# Patient Record
Sex: Female | Born: 2000 | Race: Black or African American | Hispanic: No | Marital: Single | Smoking: Never smoker
Health system: Southern US, Community
[De-identification: ages and names within clinical notes are randomized; demographics above are authoritative.]

## PROBLEM LIST (undated history)

## (undated) DIAGNOSIS — R519 Headache, unspecified: Secondary | ICD-10-CM

## (undated) DIAGNOSIS — N390 Urinary tract infection, site not specified: Secondary | ICD-10-CM

## (undated) DIAGNOSIS — R51 Headache: Secondary | ICD-10-CM

## (undated) HISTORY — DX: Urinary tract infection, site not specified: N39.0

## (undated) HISTORY — DX: Headache: R51

## (undated) HISTORY — DX: Headache, unspecified: R51.9

---
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
## 2021-01-11 ENCOUNTER — Other Ambulatory Visit: Payer: Self-pay | Admitting: Pediatrics

## 2021-02-03 ENCOUNTER — Ambulatory Visit (HOSPITAL_COMMUNITY)
Admission: EM | Admit: 2021-02-03 | Discharge: 2021-02-03 | Disposition: A | Discharge status: Home / Self Care | Payer: Medicaid Other | Attending: Physician Assistant | Admitting: Physician Assistant

## 2021-02-03 ENCOUNTER — Other Ambulatory Visit: Payer: Self-pay

## 2021-02-03 ENCOUNTER — Encounter (HOSPITAL_COMMUNITY): Payer: Self-pay | Admitting: Emergency Medicine

## 2021-02-03 DIAGNOSIS — N898 Other specified noninflammatory disorders of vagina: Secondary | ICD-10-CM | POA: Insufficient documentation

## 2021-02-03 DIAGNOSIS — N760 Acute vaginitis: Secondary | ICD-10-CM | POA: Insufficient documentation

## 2021-02-03 MED ORDER — FLUCONAZOLE 150 MG PO TABS: 150.0000 mg | ORAL_TABLET | Freq: Every day | ORAL | 0 refills | Status: DC

## 2021-02-03 NOTE — ED Triage Notes (Signed)
Patient c/o vaginal itching x 5 days.   Patient endorses " I think I have a yeast infection, last time this happened that's how I felt".   Patient denies any abnormal discharge. Patient denies any ABD pain or dysuria.   Patient endorses " wearing clothes makes it feel uncomfortable".   Patient hasn't used any medications for symptoms.

## 2021-02-03 NOTE — ED Provider Notes (Signed)
[facility]    CSN: [id number] Arrival date & time: 02/03/21  1318      History   Chief Complaint Chief Complaint  Patient presents with  . Vaginal Itching    HPI Madison Garcia is a 20 y.o. female.   Patient presents today with a several day history of vaginal irritation.  She reports thick white discharge with associated pruritus.  Denies any odor.  She denies any new sexual partners.  Denies changes to personal hygiene products including soaps, detergents, sanitary napkins, tampons.  She denies any recent antibiotic use.  She has a history of yeast and bacterial vaginosis and states current symptoms are similar to previous episodes of this condition.  She denies history of STI.  She denies associated fever, abdominal pain, pelvic pain, nausea, vomiting.  She has not tried any over-the-counter medications for symptom management.     Past Medical History:  Diagnosis Date  . Headache   . Urinary tract infection     Patient Active Problem List   Diagnosis Date Noted  . Episodic tension-type headache, not intractable 03/20/2019  . Vaginal discharge 03/04/2019  . Hematocolpos 09/17/2018  . Solitary kidney 09/17/2018  . 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 Irregular menses 08/19/2018  . Cystitis 08/19/2018  . Migraine without aura and without status migrainosus, not intractable 09/02/2014  . Tension headache 09/02/2014    History reviewed. No pertinent surgical history.  OB History   No obstetric history on file.      Home Medications    Prior to Admission medications   Medication Sig Start Date End Date Taking? Authorizing Provider  fluconazole (DIFLUCAN) 150 MG tablet Take 1 tablet (150 mg total) by mouth daily. Can take second dose 72 hours after first if symptoms persist. 02/03/21  Yes Sheryl Towell, Noberto Retort, PA-C  JUNEL 1.5/30 1.5-30 MG-MCG tablet TAKE 1 TABLET BY MOUTH EVERY DAY  01/11/21  Yes Verneda Skill, FNP    Family History Family History  Problem Relation Age of Onset  . Migraines Maternal Grandmother   . Migraines Other   . Autism Other     Social History Social History   Tobacco Use  . Smoking status: Never Smoker  . Smokeless tobacco: Never Used  Substance Use Topics  . Alcohol use: No    Comment: pt is 20yo  . Drug use: No     Allergies   Patient has no known allergies.   Review of Systems Review of Systems  Constitutional: Negative for activity change, appetite change, fatigue and fever.  Respiratory: Negative for cough and shortness of breath.   Cardiovascular: Negative for chest pain.  Gastrointestinal: Negative for abdominal pain, diarrhea, nausea and vomiting.  Genitourinary: Positive for vaginal discharge. Negative for dysuria, frequency, urgency, vaginal bleeding and vaginal pain.  Neurological: Negative for dizziness, light-headedness and headaches.     Physical Exam Triage Vital Signs ED Triage Vitals  Enc Vitals Group     BP 02/03/21 1335 111/72     Pulse Rate 02/03/21 1335 88     Resp 02/03/21 1335 16     Temp 02/03/21 1335 98.6 F (37 C)     Temp Source 02/03/21 1335 Oral     SpO2 02/03/21 1335 98 %     Weight --      Height --      Head Circumference --      Peak Flow --  Pain Score 02/03/21 1333 0     Pain Loc --      Pain Edu? --      Excl. in GC? --    No data found.  Updated Vital Signs BP 111/72 (BP Location: Right Arm)   Pulse 88   Temp 98.6 F (37 C) (Oral)   Resp 16   LMP  (LMP Unknown)   SpO2 98%   Visual Acuity Right Eye Distance:   Left Eye Distance:   Bilateral Distance:    Right Eye Near:   Left Eye Near:    Bilateral Near:     Physical Exam Vitals reviewed.  Constitutional:      General: She is awake. She is not in acute distress.    Appearance: Normal appearance. She is not ill-appearing.     Comments: Very pleasant female appears stated age in no acute distress   HENT:     Head: Normocephalic and atraumatic.  Cardiovascular:     Rate and Rhythm: Normal rate and regular rhythm.     Heart sounds: No murmur heard.   Pulmonary:     Effort: Pulmonary effort is normal.     Breath sounds: Normal breath sounds. No wheezing, rhonchi or rales.  Abdominal:     General: Bowel sounds are normal.     Palpations: Abdomen is soft.     Tenderness: There is no abdominal tenderness. There is no right CVA tenderness, left CVA tenderness, guarding or rebound.     Comments: Benign abdominal exam  Genitourinary:    Comments: Exam deferred Psychiatric:        Behavior: Behavior is cooperative.      UC Treatments / Results  Labs (all labs ordered are listed, but only abnormal results are displayed) Labs Reviewed  CERVICOVAGINAL ANCILLARY ONLY    EKG   Radiology No results found.  Procedures Procedures (including critical care time)  Medications Ordered in UC Medications - No data to display  Initial Impression / Assessment and Plan / UC Course  I have reviewed the triage vital signs and the nursing notes.  Pertinent labs & imaging results that were available during my care of the patient were reviewed by me and considered in my medical decision making (see chart for details).     Symptoms consistent with vaginal yeast infection so we will treat with Diflucan.  NuSwab collected today-results pending.  Discussed potential need to change treatment course based on laboratory findings.  She was encouraged to use hypoallergenic soaps and detergents and wear loosefitting cotton underwear.  Strict return precautions given to which patient expressed understanding.  Final Clinical Impressions(s) / UC Diagnoses   Final diagnoses:  Vaginal discharge  Vaginitis and vulvovaginitis     Discharge Instructions     Take Diflucan to treat yeast infection.  We will be in touch with your other results soon as we have these if we need to treat for something  else we will contact you.  Make sure to use hypoallergenic soaps and detergents.  Wear loosefitting cotton underwear.    ED Prescriptions    Medication Sig Dispense Auth. Provider   fluconazole (DIFLUCAN) 150 MG tablet Take 1 tablet (150 mg total) by mouth daily. Can take second dose 72 hours after first if symptoms persist. 2 tablet Avereigh Spainhower K, PA-C     PDMP not reviewed this encounter.   Jeani Hawking, PA-C 02/03/21 1426

## 2021-02-03 NOTE — Discharge Instructions (Signed)
Take Diflucan to treat yeast infection.  We will be in touch with your other results soon as we have these if we need to treat for something else we will contact you.  Make sure to use hypoallergenic soaps and detergents.  Wear loosefitting cotton underwear.

## 2021-02-06 ENCOUNTER — Telehealth (HOSPITAL_COMMUNITY): Payer: Self-pay | Admitting: Emergency Medicine

## 2021-02-06 LAB — CERVICOVAGINAL ANCILLARY ONLY
Bacterial Vaginitis (gardnerella): NEGATIVE
Candida Glabrata: NEGATIVE
Candida Vaginitis: POSITIVE — AB
Chlamydia: NEGATIVE
Comment: NEGATIVE
Comment: NEGATIVE
Comment: NEGATIVE
Comment: NEGATIVE
Comment: NEGATIVE
Comment: NORMAL
Neisseria Gonorrhea: NEGATIVE
Trichomonas: POSITIVE — AB

## 2021-02-06 MED ORDER — METRONIDAZOLE 500 MG PO TABS: 500.0000 mg | ORAL_TABLET | Freq: Two times a day (BID) | ORAL | 0 refills | Status: DC

## 2021-02-16 ENCOUNTER — Encounter (INDEPENDENT_AMBULATORY_CARE_PROVIDER_SITE_OTHER): Payer: Self-pay

## 2021-05-01 ENCOUNTER — Other Ambulatory Visit: Payer: Self-pay

## 2021-05-01 ENCOUNTER — Ambulatory Visit (HOSPITAL_COMMUNITY)
Admission: EM | Admit: 2021-05-01 | Discharge: 2021-05-01 | Disposition: A | Discharge status: Home / Self Care | Payer: Medicaid Other | Attending: Emergency Medicine | Admitting: Emergency Medicine

## 2021-05-01 ENCOUNTER — Encounter (HOSPITAL_COMMUNITY): Payer: Self-pay

## 2021-05-01 DIAGNOSIS — Z113 Encounter for screening for infections with a predominantly sexual mode of transmission: Secondary | ICD-10-CM | POA: Insufficient documentation

## 2021-05-01 DIAGNOSIS — N898 Other specified noninflammatory disorders of vagina: Secondary | ICD-10-CM | POA: Diagnosis not present

## 2021-05-01 LAB — POCT URINALYSIS DIPSTICK, ED / UC
Bilirubin Urine: NEGATIVE
Glucose, UA: NEGATIVE mg/dL
Ketones, ur: NEGATIVE mg/dL
Nitrite: NEGATIVE
Protein, ur: NEGATIVE mg/dL
Specific Gravity, Urine: 1.025 (ref 1.005–1.030)
Urobilinogen, UA: 0.2 mg/dL (ref 0.0–1.0)
pH: 6 (ref 5.0–8.0)

## 2021-05-01 LAB — POC URINE PREG, ED: Preg Test, Ur: NEGATIVE

## 2021-05-01 NOTE — ED Provider Notes (Signed)
[facility]    CSN: [id number] Arrival date & time: 05/01/21  1202      History   Chief Complaint Chief Complaint  Patient presents with   Vaginal Discharge    HPI Madison Garcia is a 20 y.o. female.   Patient here for evaluation of vaginal itching and discharge that has been ongoing for the past week.  Reports similar symptoms with yeast infections in the past.  Also reports some dysuria but denies any frequency, urgency, or flank pain.  Has not tried any OTC medications or treatments.  Denies any trauma, injury, or other precipitating event.  Denies any specific alleviating or aggravating factors.  Denies any fevers, chest pain, shortness of breath, N/V/D, numbness, tingling, weakness, abdominal pain, or headaches.     The history is provided by the patient.  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 No pertinent surgical history.  OB History   No obstetric history on file.      Home Medications    Prior to Admission medications   Medication Sig Start Date End Date Taking? Authorizing Provider  fluconazole (DIFLUCAN) 150 MG tablet Take 1 tablet (150 mg total) by mouth daily. Can take second dose 72 hours after first if symptoms persist. 02/03/21   Raspet, Noberto Retort, PA-C  JUNEL 1.5/30 1.5-30 MG-MCG tablet TAKE 1 TABLET BY MOUTH EVERY DAY 01/11/21   Verneda Skill, FNP   metroNIDAZOLE (FLAGYL) 500 MG tablet Take 1 tablet (500 mg total) by mouth 2 (two) times daily. 02/06/21   Lamptey, Britta Mccreedy, MD    Family History Family History  Problem Relation Age of Onset   Migraines Maternal Grandmother    Migraines Other    Autism Other     Social History Social History   Tobacco Use   Smoking status: Never   Smokeless tobacco: Never  Substance Use Topics   Alcohol use: No    Comment: pt is 20yo   Drug use: No     Allergies   Patient has no known allergies.   Review of Systems Review of Systems  Genitourinary:  Positive for dysuria and vaginal discharge.  All other systems reviewed and are negative.   Physical Exam Triage Vital Signs ED Triage Vitals [05/01/21 1326]  Enc Vitals Group     BP 112/76     Pulse Rate 78     Resp 18     Temp 98.4 F (36.9 C)     Temp Source Oral     SpO2 99 %     Weight      Height      Head Circumference      Peak Flow      Pain Score 0     Pain Loc      Pain Edu?      Excl. in GC?    No data found.  Updated Vital Signs BP 112/76 (BP  Location: Right Arm)   Pulse 78   Temp 98.4 F (36.9 C) (Oral)   Resp 18   SpO2 99%   Visual Acuity Right Eye Distance:   Left Eye Distance:   Bilateral Distance:    Right Eye Near:   Left Eye Near:    Bilateral Near:     Physical Exam Vitals and nursing note reviewed.  Constitutional:      General: She is not in acute distress.    Appearance: Normal appearance. She is not ill-appearing, toxic-appearing or diaphoretic.  HENT:     Head: Normocephalic and atraumatic.  Eyes:     Conjunctiva/sclera: Conjunctivae normal.  Cardiovascular:     Rate and Rhythm: Normal rate.     Pulses: Normal pulses.  Pulmonary:     Effort: Pulmonary effort is normal.  Abdominal:     General: Abdomen is flat.     Tenderness: There is no right CVA tenderness or left CVA tenderness.  Genitourinary:    Comments: declines Musculoskeletal:        General: Normal range of  motion.     Cervical back: Normal range of motion.  Skin:    General: Skin is warm and dry.  Neurological:     General: No focal deficit present.     Mental Status: She is alert and oriented to person, place, and time.  Psychiatric:        Mood and Affect: Mood normal.     UC Treatments / Results  Labs (all labs ordered are listed, but only abnormal results are displayed) Labs Reviewed  POCT URINALYSIS DIPSTICK, ED / UC - Abnormal; Notable for the following components:      Result Value   Hgb urine dipstick TRACE (*)    Leukocytes,Ua SMALL (*)    All other components within normal limits  URINE CULTURE  POC URINE PREG, ED  CERVICOVAGINAL ANCILLARY ONLY    EKG   Radiology No results found.  Procedures Procedures (including critical care time)  Medications Ordered in UC Medications - No data to display  Initial Impression / Assessment and Plan / UC Course  I have reviewed the triage vital signs and the nursing notes.  Pertinent labs & imaging results that were available during my care of the patient were reviewed by me and considered in my medical decision making (see chart for details).    Assessment negative for red flags or concerns.  Urine pregnancy test is negative, urinalysis positive for leukocytes and hgb.  Urine culture pending.  Self swab pending, will treat based on results.  Discussed trying to stay clean and dry but avoiding tight fitting clothing and wearing breathable fabrics to prevent development of yeast.  Also discussed safe sex practices such as condom or other barrier method use.  Follow up with primary care as needed.   Final Clinical Impressions(s) / UC Diagnoses   Final diagnoses:  Vaginal irritation  Screen for STD (sexually transmitted disease)     Discharge Instructions      We will contact you with the results from your lab work and any additional treatment.    Avoid wearing tight fitting clothing.  Try to wear breathable fabrics,  such as cotton.  Keep the area clean and dry to help prevent the development of yeast.    Do not have sex while taking undergoing treatment for STI.  Make sure that all of your partners get tested and treated.   Use a condom or other barrier method  for all sexual encounters.    Return or go to the Emergency Department if symptoms worsen or do not improve in the next few days.      ED Prescriptions   None    PDMP not reviewed this encounter.   Ivette Loyal, NP 05/01/21 [phone]

## 2021-05-01 NOTE — Discharge Instructions (Addendum)
We will contact you with the results from your lab work and any additional treatment.    Avoid wearing tight fitting clothing.  Try to wear breathable fabrics, such as cotton.  Keep the area clean and dry to help prevent the development of yeast.    Do not have sex while taking undergoing treatment for STI.  Make sure that all of your partners get tested and treated.   Use a condom or other barrier method for all sexual encounters.    Return or go to the Emergency Department if symptoms worsen or do not improve in the next few days.

## 2021-05-01 NOTE — ED Triage Notes (Signed)
Pt presents with vaginal discharge and vaginal itching X 1 week.

## 2021-05-02 LAB — CERVICOVAGINAL ANCILLARY ONLY
Bacterial Vaginitis (gardnerella): NEGATIVE
Candida Glabrata: NEGATIVE
Candida Vaginitis: POSITIVE — AB
Chlamydia: NEGATIVE
Comment: NEGATIVE
Comment: NEGATIVE
Comment: NEGATIVE
Comment: NEGATIVE
Comment: NEGATIVE
Comment: NORMAL
Neisseria Gonorrhea: NEGATIVE
Trichomonas: NEGATIVE

## 2021-05-03 LAB — URINE CULTURE: Culture: 3000 — AB

## 2021-05-05 ENCOUNTER — Telehealth: Payer: Self-pay

## 2021-05-05 MED ORDER — FLUCONAZOLE 150 MG PO TABS: 150.0000 mg | ORAL_TABLET | Freq: Every day | ORAL | 0 refills | Status: AC

## 2021-05-18 ENCOUNTER — Other Ambulatory Visit: Payer: Self-pay | Admitting: Pediatrics

## 2021-09-21 ENCOUNTER — Other Ambulatory Visit: Payer: Self-pay | Admitting: Family

## 2022-01-11 IMAGING — CT CT ABD-PELV W/ CM
2 of 4 series · 15 of 46 positions shown, 17 images · IV contrast (omnipaque)
Comparison: Abdominal radiograph 11/19/2010, 11/30/2005

CLINICAL DATA: Right lower quadrant abdominal pain

EXAM:
CT ABDOMEN AND PELVIS WITH CONTRAST
TECHNIQUE: Multidetector CT imaging of the abdomen and pelvis was performed
using the standard protocol following bolus administration of
intravenous contrast.
CONTRAST:  100mL OMNIPAQUE IOHEXOL 300 MG/ML  SOLN

[Series 2: axial st · axial · 0.56mm/px · z∈[-354,-4]mm · 12 of 80 slices shown, 14 images]
[im 5/80  soft-tissue]
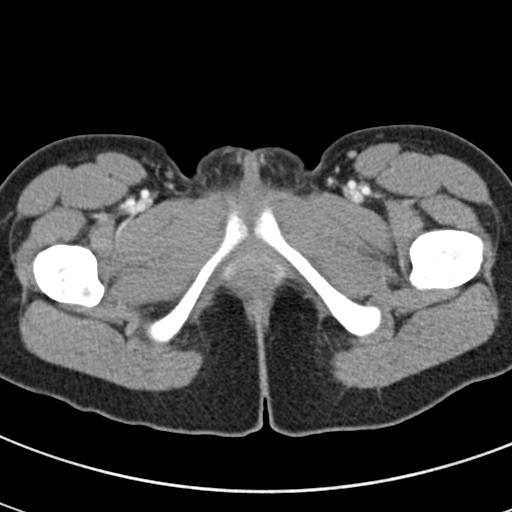
[im 5/80  bone]
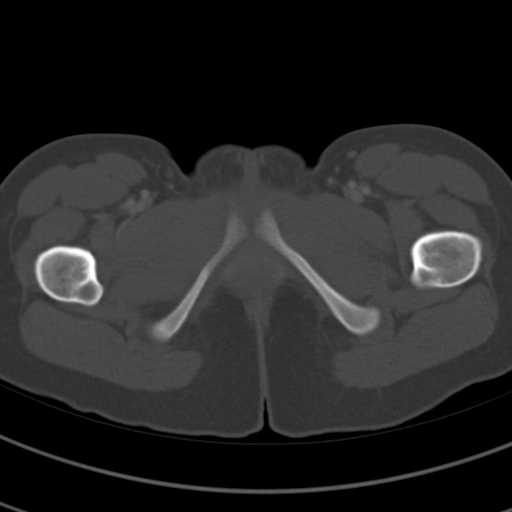
[im 13/80  soft-tissue]
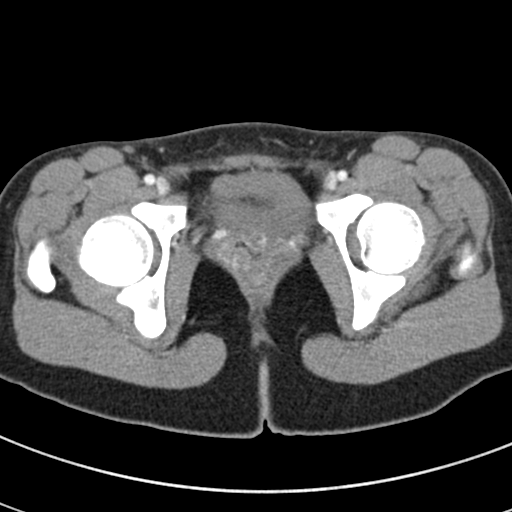
[im 17/80  soft-tissue]
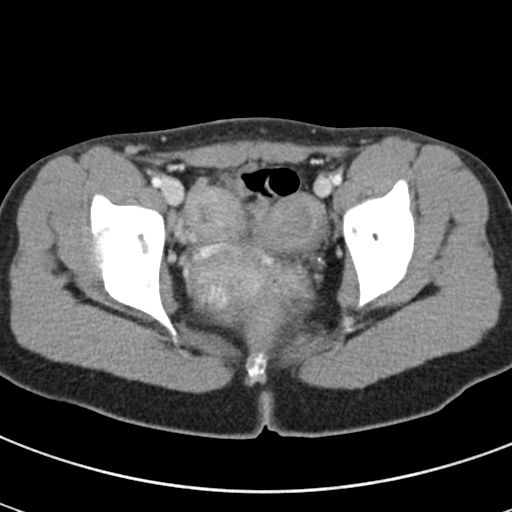
[im 25/80  soft-tissue]
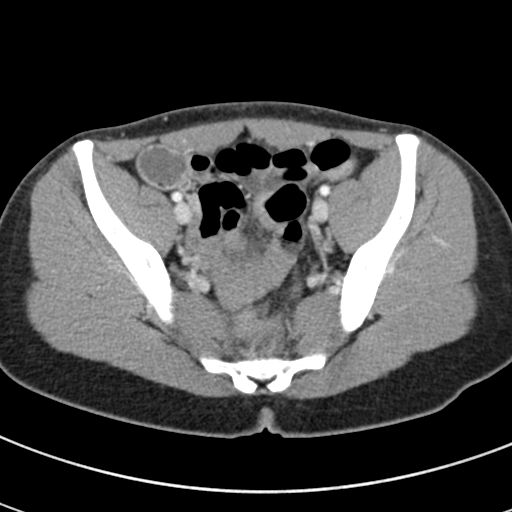
[im 30/80  soft-tissue]
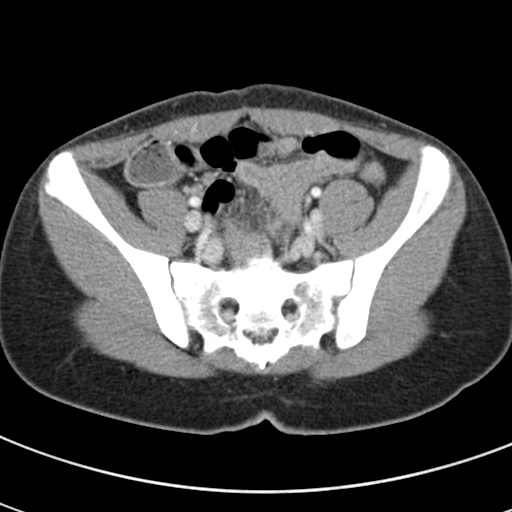
[im 38/80  soft-tissue]
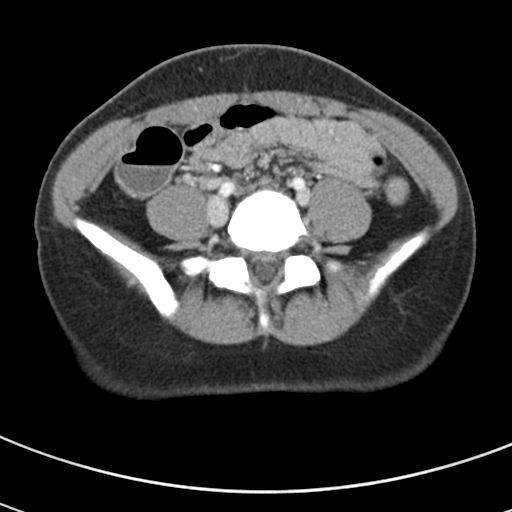
[im 42/80  soft-tissue]
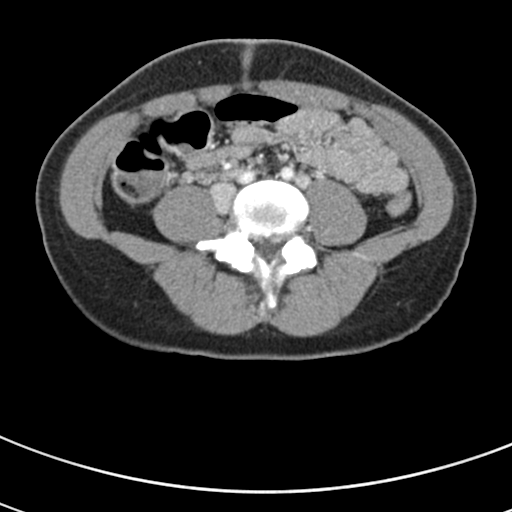
[im 50/80  soft-tissue]
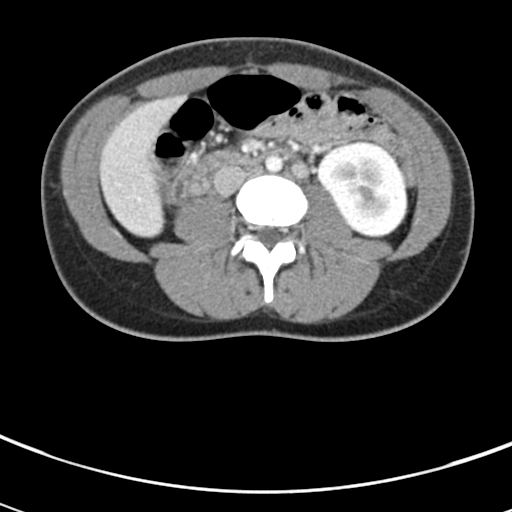
[im 55/80  soft-tissue]
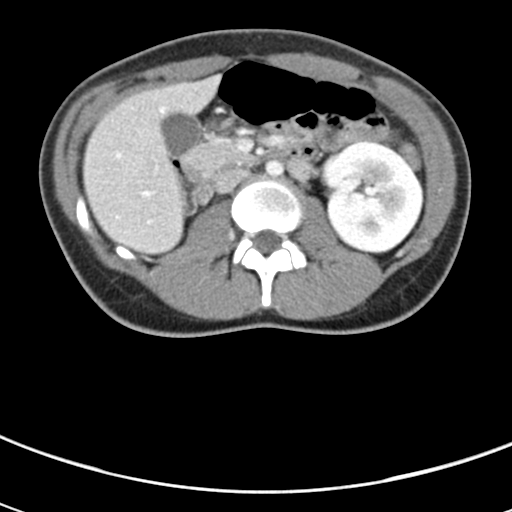
[im 55/80  bone]
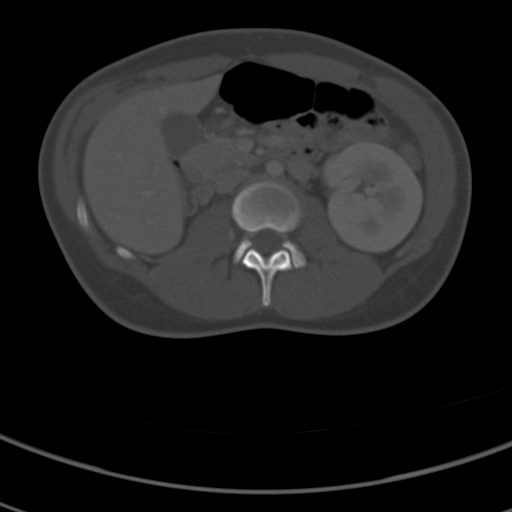
[im 63/80  soft-tissue]
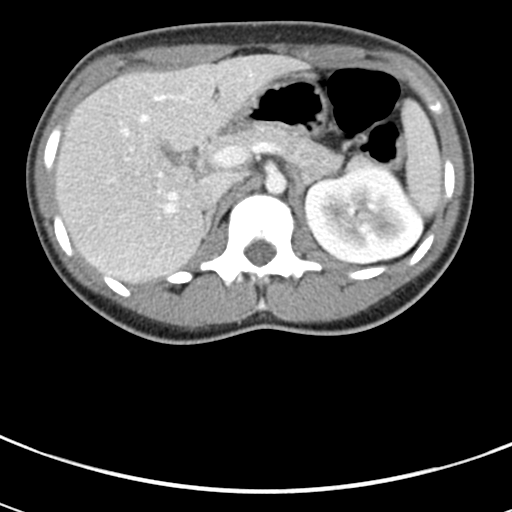
[im 67/80  soft-tissue]
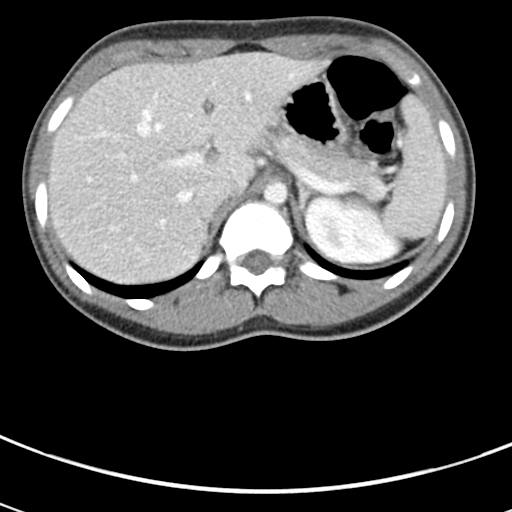
[im 75/80  soft-tissue]
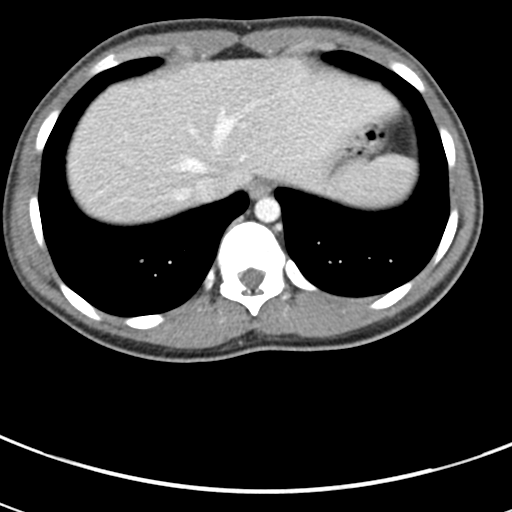

[Series 5: coronal st · coronal · 0.60mm/px · 3 of 118 slices shown]
[im 40/118  soft-tissue]
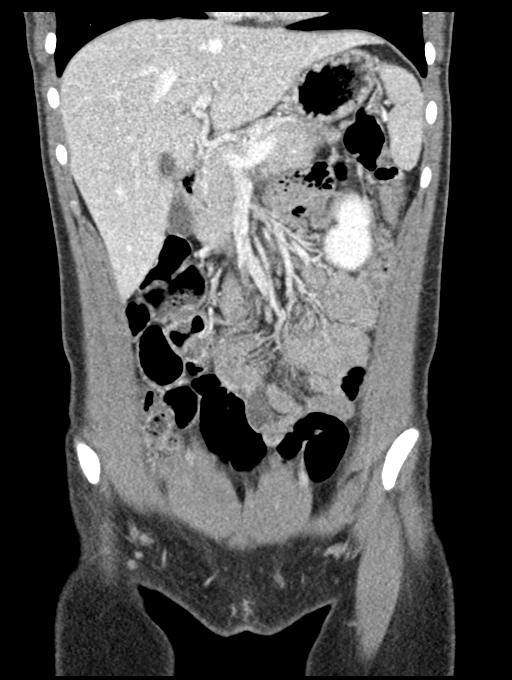
[im 53/118  soft-tissue]
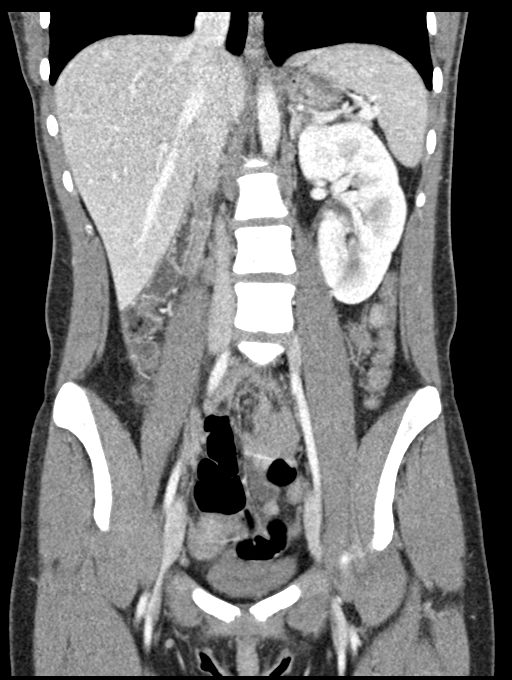
[im 66/118  soft-tissue]
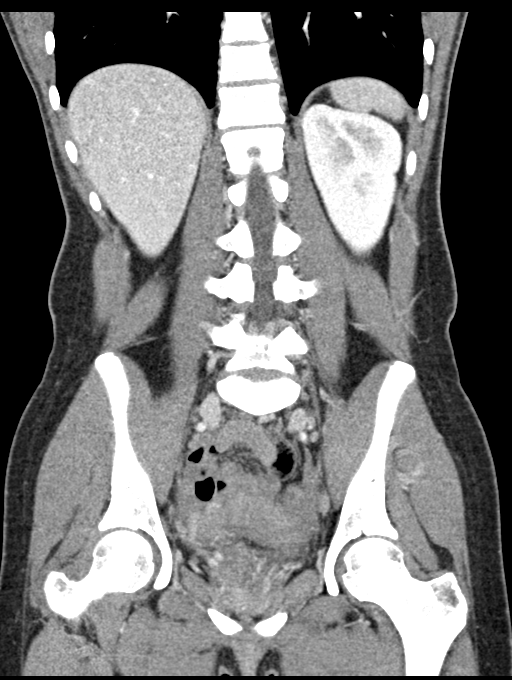

[15 of 46 positions shown; findings below may reference images not displayed]

FINDINGS: Lower chest: Lung bases are clear. Normal heart size. No pericardial
effusion.

Hepatobiliary: No worrisome focal liver abnormality is seen. Normal
gallbladder. No visible calcified gallstones. No biliary ductal
dilatation.

Pancreas: Unremarkable. No pancreatic ductal dilatation or
surrounding inflammatory changes.

Spleen: Normal in size without focal abnormality.

Adrenals/Urinary Tract: Normal adrenal glands including a flattened
appearance of the right gland likely secondary to absence of the
right kidney. The left kidney enhances normally and uniformly. No
concerning renal mass or urolithiasis. Urinary bladder is largely
decompressed at the time of exam and therefore poorly evaluated by
CT imaging. Circumferential mural thickening is present, possibly
related to underdistention.

Stomach/Bowel: Distal esophagus, stomach and duodenal sweep are
unremarkable. No small bowel wall thickening or dilatation. No
evidence of obstruction. A fluid-filled appendix measures
approximately 7 mm in diameter, upper limits normal. No focal
periappendiceal stranding or inflammation is seen however. No
colonic dilatation or wall thickening.

Vascular/Lymphatic: Slightly engorged right parametrial vessels. No
other significant vascular findings are present. No enlarged
abdominal or pelvic lymph nodes.

Reproductive: There is a bicornuate bicollis appearance of the
uterus. Slightly asymmetric venous engorgement about the right
uterine horn. No concerning adnexal lesions are identified.

Other: Trace free fluid in the pelvis, nonspecific in a reproductive
age female. No free air. No bowel containing hernias.

Musculoskeletal: Rudimentary disc at S1-2. No acute osseous
abnormality or suspicious osseous lesion.
IMPRESSION: 1. A fluid-filled appendix measures approximately 7 mm in diameter,
upper limits normal. No focal periappendiceal stranding or
inflammation is seen however. Findings are equivocal for early acute
appendicitis.
2. Circumferentially thickened bladder, possibly related to
underdistention though could correlate for urinary symptoms and
consider urinalysis to exclude cystitis.
3. Slightly engorged right parametrial vessels, which can be seen
with pelvic congestion syndrome in the appropriate clinical setting.
4. Incidental note made of a bicornuate bicollis appearance of the
uterus. Could be better delineated with outpatient nonemergent
ultrasound.
5. Likely congenital absence of the right kidney.
6. Trace free fluid in the pelvis, nonspecific in a reproductive age
female.

## 2022-01-28 ENCOUNTER — Other Ambulatory Visit: Payer: Self-pay | Admitting: Pediatrics

## 2022-02-06 ENCOUNTER — Other Ambulatory Visit: Payer: Self-pay | Admitting: Pediatrics

## 2022-03-01 ENCOUNTER — Encounter (HOSPITAL_COMMUNITY): Payer: Self-pay

## 2022-03-01 ENCOUNTER — Ambulatory Visit (HOSPITAL_COMMUNITY)
Admission: EM | Admit: 2022-03-01 | Discharge: 2022-03-01 | Disposition: A | Discharge status: Home / Self Care | Payer: Medicaid Other | Attending: Family Medicine | Admitting: Family Medicine

## 2022-03-01 DIAGNOSIS — N912 Amenorrhea, unspecified: Secondary | ICD-10-CM | POA: Diagnosis not present

## 2022-03-01 DIAGNOSIS — Z113 Encounter for screening for infections with a predominantly sexual mode of transmission: Secondary | ICD-10-CM | POA: Diagnosis not present

## 2022-03-01 DIAGNOSIS — N898 Other specified noninflammatory disorders of vagina: Secondary | ICD-10-CM | POA: Insufficient documentation

## 2022-03-01 LAB — POCT URINALYSIS DIPSTICK, ED / UC
Bilirubin Urine: NEGATIVE
Glucose, UA: NEGATIVE mg/dL
Ketones, ur: NEGATIVE mg/dL
Nitrite: NEGATIVE
Protein, ur: NEGATIVE mg/dL
Specific Gravity, Urine: 1.025 (ref 1.005–1.030)
Urobilinogen, UA: 0.2 mg/dL (ref 0.0–1.0)
pH: 6 (ref 5.0–8.0)

## 2022-03-01 LAB — POC URINE PREG, ED: Preg Test, Ur: NEGATIVE

## 2022-03-01 NOTE — ED Provider Notes (Signed)
[facility]    CSN: [id number] Arrival date & time: 03/01/22  9381      History   Chief Complaint Chief Complaint  Patient presents with   Vaginal Discharge   Vaginal Itching    HPI KEBRINA FRIEND is a 21 y.o. female.   Patient is here for vaginal itching, d/c and odor for several days.  Clear/yellow/sticky color;   going on x 1 week.  Sexually active 1 partner, inconsistent condom use.  No abdominal pain or fevers.  She was on ocp, but ran out 2 months, no period since.  No known std exposure.   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  OB History   No obstetric history on file.      Home Medications    Prior to Admission medications   Medication Sig Start Date End Date Taking? Authorizing Provider  fluconazole (DIFLUCAN) 150 MG tablet Take 1 tablet (150 mg total) by mouth daily. Can take second dose 72 hours after first if symptoms persist. 02/03/21   Raspet, Noberto Retort, PA-C  JUNEL 1.5/30 1.5-30 MG-MCG tablet TAKE 1 TABLET BY MOUTH EVERY DAY 09/21/21   Verneda Skill, FNP  metroNIDAZOLE (FLAGYL) 500 MG tablet Take 1 tablet (500 mg total) by mouth 2 (two) times daily. 02/06/21   Lamptey, Britta Mccreedy, MD    Family History Family History  Problem Relation Age of Onset   Migraines Maternal Grandmother    Migraines Other    Autism Other     Social History Social History   Tobacco Use   Smoking status:  Never   Smokeless tobacco: Never  Substance Use Topics   Alcohol use: No    Comment: pt is 21yo   Drug use: No     Allergies   Patient has no known allergies.   Review of Systems Review of Systems  Constitutional: Negative.   HENT: Negative.    Respiratory: Negative.    Cardiovascular: Negative.   Gastrointestinal: Negative.   Genitourinary:  Positive for vaginal discharge.    Physical Exam Triage Vital Signs ED Triage Vitals  Enc Vitals Group     BP 03/01/22 1039 102/63     Pulse Rate 03/01/22 1039 72     Resp 03/01/22 1039 18     Temp 03/01/22 1039 98.2 F (36.8 C)     Temp Source 03/01/22 1039 Oral     SpO2 03/01/22 1039 97 %     Weight --      Height --      Head Circumference --      Peak Flow --      Pain Score 03/01/22 1044 0     Pain Loc --      Pain Edu? --      Excl. in GC? --    No data found.  Updated Vital Signs BP 102/63 (BP Location: Right Arm)   Pulse 72   Temp 98.2  F (36.8 C) (Oral)   Resp 18   SpO2 97%   Visual Acuity Right Eye Distance:   Left Eye Distance:   Bilateral Distance:    Right Eye Near:   Left Eye Near:    Bilateral Near:     Physical Exam Cardiovascular:     Rate and Rhythm: Normal rate.  Pulmonary:     Effort: Pulmonary effort is normal.  Neurological:     General: No focal deficit present.     Mental Status: She is alert.  Psychiatric:        Mood and Affect: Mood normal.     UC Treatments / Results  Labs (all labs ordered are listed, but only abnormal results are displayed) Labs Reviewed  POCT URINALYSIS DIPSTICK, ED / UC - Abnormal; Notable for the following components:      Result Value   Hgb urine dipstick TRACE (*)    Leukocytes,Ua SMALL (*)    All other components within normal limits  POC URINE PREG, ED  CERVICOVAGINAL ANCILLARY ONLY    EKG   Radiology No results found.  Procedures Procedures (including critical care time)  Medications Ordered in UC Medications - No data to  display  Initial Impression / Assessment and Plan / UC Course  I have reviewed the triage vital signs and the nursing notes.  Pertinent labs & imaging results that were available during my care of the patient were reviewed by me and considered in my medical decision making (see chart for details).    Final Clinical Impressions(s) / UC Diagnoses   Final diagnoses:  Vaginal discharge  Screen for STD (sexually transmitted disease)  Amenorrhea     Discharge Instructions      You were seen today for vaginal discharge.  Your swab will be resulted tomorrow.  We will call and notify you of results and start treatment if necessary.  These will be resulted via my chart for you to see.  Your urine was normal.  Your pregnancy test was negative today.      ED Prescriptions   None    PDMP not reviewed this encounter.   Jannifer Franklin, MD 03/01/22 1101

## 2022-03-01 NOTE — ED Triage Notes (Signed)
Pt presents with abnormal vaginal discharge, vaginal itching and abnormal vaginal odor for past few days.

## 2022-03-01 NOTE — Discharge Instructions (Signed)
You were seen today for vaginal discharge.  Your swab will be resulted tomorrow.  We will call and notify you of results and start treatment if necessary.  These will be resulted via my chart for you to see.  Your urine was normal.  Your pregnancy test was negative today.

## 2022-03-02 ENCOUNTER — Telehealth: Payer: Self-pay

## 2022-03-02 LAB — CERVICOVAGINAL ANCILLARY ONLY
Bacterial Vaginitis (gardnerella): NEGATIVE
Candida Glabrata: NEGATIVE
Candida Vaginitis: NEGATIVE
Chlamydia: POSITIVE — AB
Comment: NEGATIVE
Comment: NEGATIVE
Comment: NEGATIVE
Comment: NEGATIVE
Comment: NEGATIVE
Comment: NORMAL
Neisseria Gonorrhea: NEGATIVE
Trichomonas: NEGATIVE

## 2022-03-02 MED ORDER — DOXYCYCLINE HYCLATE 100 MG PO CAPS: 100.0000 mg | ORAL_CAPSULE | Freq: Two times a day (BID) | ORAL | 0 refills | Status: DC

## 2022-04-22 ENCOUNTER — Encounter (HOSPITAL_COMMUNITY): Payer: Self-pay

## 2022-04-22 ENCOUNTER — Ambulatory Visit (HOSPITAL_COMMUNITY)
Admission: EM | Admit: 2022-04-22 | Discharge: 2022-04-22 | Disposition: A | Discharge status: Home / Self Care | Payer: Medicaid Other | Attending: Internal Medicine | Admitting: Internal Medicine

## 2022-04-22 DIAGNOSIS — N760 Acute vaginitis: Secondary | ICD-10-CM | POA: Diagnosis present

## 2022-04-22 DIAGNOSIS — N898 Other specified noninflammatory disorders of vagina: Secondary | ICD-10-CM | POA: Diagnosis present

## 2022-04-22 DIAGNOSIS — R300 Dysuria: Secondary | ICD-10-CM | POA: Diagnosis not present

## 2022-04-22 LAB — POCT URINALYSIS DIPSTICK, ED / UC
Bilirubin Urine: NEGATIVE
Glucose, UA: NEGATIVE mg/dL
Ketones, ur: NEGATIVE mg/dL
Nitrite: NEGATIVE
Protein, ur: NEGATIVE mg/dL
Specific Gravity, Urine: 1.025 (ref 1.005–1.030)
Urobilinogen, UA: 0.2 mg/dL (ref 0.0–1.0)
pH: 6 (ref 5.0–8.0)

## 2022-04-22 LAB — POC URINE PREG, ED: Preg Test, Ur: NEGATIVE

## 2022-04-22 MED ORDER — METRONIDAZOLE 500 MG PO TABS: 500.0000 mg | ORAL_TABLET | Freq: Two times a day (BID) | ORAL | 0 refills | Status: DC

## 2022-04-22 NOTE — ED Provider Notes (Signed)
[facility]    CSN: [id number] Arrival date & time: 04/22/22  1058      History   Chief Complaint Chief Complaint  Patient presents with   Vaginal Itching   Vaginal Discharge    HPI Madison Garcia is a 21 y.o. female.   21 year old female presents with vaginal discharge with odor.  Patient indicates for the past 3 days she has been having vaginal discharge that has an odor to it with mild itching.  Indicates that the discharge is a yellowish in color.  Patient relates she does not know what the odor smells like.  Patient indicates that she is not having any fever, chills, back pain.  Patient indicates she is having some frequency without dysuria.  Patient relates that she stopped her birth control pills couple weeks ago due to questionable elevated liver functions.  Patient indicates that her period was about 10 days ago and it was normal.  Patient relates that her last episode of intercourse was about a month ago and it was unprotected, she was on birth control at that time.   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 No pertinent surgical history.  OB History   No obstetric history on file.      Home Medications    Prior to Admission medications   Medication Sig Start Date End Date Taking? Authorizing Provider  metroNIDAZOLE (FLAGYL) 500 MG tablet  Take 1 tablet (500 mg total) by mouth 2 (two) times daily. 04/22/22  Yes Ellsworth Lennox, PA-C    Family History Family History  Problem Relation Age of Onset   Migraines Maternal Grandmother    Migraines Other    Autism Other     Social History Social History   Tobacco Use   Smoking status: Never   Smokeless tobacco: Never  Substance Use Topics   Alcohol use: No    Comment: pt is 21yo   Drug use: No     Allergies   Patient has no known allergies.   Review of Systems Review of Systems  Genitourinary:  Positive for vaginal discharge (with itching).     Physical Exam Triage Vital Signs ED Triage Vitals  Enc Vitals Group     BP 04/22/22 1120 110/71     Pulse Rate 04/22/22 1120 60     Resp 04/22/22 1120 16     Temp 04/22/22 1120 98.7 F (37.1 C)     Temp Source 04/22/22 1120 Oral     SpO2 04/22/22 1120 98 %     Weight 04/22/22 1121 112 lb (50.8 kg)     Height 04/22/22 1121 5\' 1"  (1.549 m)     Head Circumference --      Peak Flow --      Pain Score 04/22/22 1121 0     Pain Loc --      Pain Edu? --  Excl. in GC? --    No data found.  Updated Vital Signs BP 110/71 (BP Location: Left Arm)   Pulse 60   Temp 98.7 F (37.1 C) (Oral)   Resp 16   Ht 5\' 1"  (1.549 m)   Wt 112 lb (50.8 kg)   LMP 04/13/2022 (Within Days)   SpO2 98%   BMI 21.16 kg/m   Visual Acuity Right Eye Distance:   Left Eye Distance:   Bilateral Distance:    Right Eye Near:   Left Eye Near:    Bilateral Near:     Physical Exam Constitutional:      Appearance: Normal appearance.  Abdominal:     General: Abdomen is flat. Bowel sounds are normal.     Palpations: Abdomen is soft.     Tenderness: There is no abdominal tenderness. There is no guarding or rebound.  Neurological:     Mental Status: She is alert.      UC Treatments / Results  Labs (all labs ordered are listed, but only abnormal results are displayed) Labs Reviewed  POCT URINALYSIS DIPSTICK, ED / UC - Abnormal;  Notable for the following components:      Result Value   Hgb urine dipstick TRACE (*)    Leukocytes,Ua TRACE (*)    All other components within normal limits  URINE CULTURE  POC URINE PREG, ED  CERVICOVAGINAL ANCILLARY ONLY    EKG   Radiology No results found.  Procedures Procedures (including critical care time)  Medications Ordered in UC Medications - No data to display  Initial Impression / Assessment and Plan / UC Course  I have reviewed the triage vital signs and the nursing notes.  Pertinent labs & imaging results that were available during my care of the patient were reviewed by me and considered in my medical decision making (see chart for details).    Plan: 1.  Advised patient to take the Flagyl 500 mg 1 tab twice daily until completed to treat the vaginitis. 2.  Advised patient to increase fluid intake, observe, follow-up with PCP or return to urgent care if symptoms fail to improve. 3.  Urine culture is pending. 4.  STI test for GC/chlamydia/BV/trichomoniasis is pending. Final Clinical Impressions(s) / UC Diagnoses   Final diagnoses:  Acute vaginitis  Vaginal itching  Dysuria     Discharge Instructions      Advised to take the Flagyl 1 tablet twice daily until completed. Advised to follow-up with PCP or return to urgent care if symptoms fail to improve. The cultures will take 48 hours to be completed, the nurse will call you with results if any are positive.  If you do not hear from the nurse within 48 hours then that means that all test are negative.    ED Prescriptions     Medication Sig Dispense Auth. Provider   metroNIDAZOLE (FLAGYL) 500 MG tablet Take 1 tablet (500 mg total) by mouth 2 (two) times daily. 14 tablet 04/15/2022, PA-C      PDMP not reviewed this encounter.   Ellsworth Lennox, PA-C 04/22/22 1159

## 2022-04-22 NOTE — ED Triage Notes (Signed)
Since Thursday Patient having vaginal itching, yellow discharge, and slight odor.   Patient denies burning urination, no low back.abdominal pain, no blood in urine.

## 2022-04-22 NOTE — Discharge Instructions (Signed)
Advised to take the Flagyl 1 tablet twice daily until completed. Advised to follow-up with PCP or return to urgent care if symptoms fail to improve. The cultures will take 48 hours to be completed, the nurse will call you with results if any are positive.  If you do not hear from the nurse within 48 hours then that means that all test are negative.

## 2022-04-23 LAB — URINE CULTURE: Culture: 5000 — AB

## 2022-04-24 LAB — CERVICOVAGINAL ANCILLARY ONLY
Bacterial Vaginitis (gardnerella): NEGATIVE
Candida Glabrata: NEGATIVE
Candida Vaginitis: NEGATIVE
Chlamydia: NEGATIVE
Comment: NEGATIVE
Comment: NEGATIVE
Comment: NEGATIVE
Comment: NEGATIVE
Comment: NEGATIVE
Comment: NORMAL
Neisseria Gonorrhea: NEGATIVE
Trichomonas: NEGATIVE

## 2022-05-15 ENCOUNTER — Other Ambulatory Visit: Payer: Self-pay

## 2022-05-15 ENCOUNTER — Emergency Department (HOSPITAL_COMMUNITY)
Admission: EM | Admit: 2022-05-15 | Discharge: 2022-05-16 | Disposition: A | Discharge status: Home / Self Care | Payer: Medicaid Other | Attending: Emergency Medicine | Admitting: Emergency Medicine

## 2022-05-15 DIAGNOSIS — R7309 Other abnormal glucose: Secondary | ICD-10-CM | POA: Insufficient documentation

## 2022-05-15 DIAGNOSIS — R42 Dizziness and giddiness: Secondary | ICD-10-CM | POA: Diagnosis not present

## 2022-05-15 DIAGNOSIS — R002 Palpitations: Secondary | ICD-10-CM | POA: Diagnosis not present

## 2022-05-15 DIAGNOSIS — R55 Syncope and collapse: Secondary | ICD-10-CM | POA: Insufficient documentation

## 2022-05-15 LAB — I-STAT BETA HCG BLOOD, ED (MC, WL, AP ONLY): I-stat hCG, quantitative: <5 m[IU]/mL (ref <5)

## 2022-05-15 LAB — CBC WITH DIFFERENTIAL/PLATELET
Abs Immature Granulocytes: 0.04 10*3/uL (ref 0.00–0.07)
Basophils Absolute: 0 10*3/uL (ref 0.0–0.1)
Basophils Relative: 0 %
Eosinophils Absolute: 0.1 10*3/uL (ref 0.0–0.5)
Eosinophils Relative: 1 %
HCT: 43.1 % (ref 36.0–46.0)
Hemoglobin: 13.9 g/dL (ref 12.0–15.0)
Immature Granulocytes: 0 %
Lymphocytes Relative: 11 %
Lymphs Abs: 1.6 10*3/uL (ref 0.7–4.0)
MCH: 28.8 pg (ref 26.0–34.0)
MCHC: 32.3 g/dL (ref 30.0–36.0)
MCV: 89.2 fL (ref 80.0–100.0)
Monocytes Absolute: 0.6 10*3/uL (ref 0.1–1.0)
Monocytes Relative: 4 %
Neutro Abs: 12.1 10*3/uL — ABNORMAL HIGH (ref 1.7–7.7)
Neutrophils Relative %: 84 %
Platelets: 332 10*3/uL (ref 150–400)
RBC: 4.83 MIL/uL (ref 3.87–5.11)
RDW: 12.3 % (ref 11.5–15.5)
WBC: 14.4 10*3/uL — ABNORMAL HIGH (ref 4.0–10.5)
nRBC: 0 % (ref 0.0–0.2)

## 2022-05-15 LAB — BASIC METABOLIC PANEL
Anion gap: 9 (ref 5–15)
BUN: 8 mg/dL (ref 6–20)
CO2: 21 mmol/L — ABNORMAL LOW (ref 22–32)
Calcium: 9.3 mg/dL (ref 8.9–10.3)
Chloride: 104 mmol/L (ref 98–111)
Creatinine, Ser: 0.62 mg/dL (ref 0.44–1.00)
GFR, Estimated: >60 mL/min (ref >60)
Glucose, Bld: 77 mg/dL (ref 70–99)
Potassium: 3.7 mmol/L (ref 3.5–5.1)
Sodium: 134 mmol/L — ABNORMAL LOW (ref 135–145)

## 2022-05-15 LAB — CBG MONITORING, ED: Glucose-Capillary: 126 mg/dL — ABNORMAL HIGH (ref 70–99)

## 2022-05-15 NOTE — ED Triage Notes (Signed)
Patient coming to ED for evaluation after having a witness syncopal episode. C/o HA at this time.  No hx of diabetes.

## 2022-05-15 NOTE — ED Provider Triage Note (Signed)
Emergency Medicine Provider Triage Evaluation Note  Madison Garcia , a 21 y.o. female  was evaluated in triage.  Pt complains of LOC prior to arrival.  Patient notes that she was at clinicals and had an episode of syncope after changing a brief period notes that beforehand she was feeling dizzy and lightheaded.  Notes that she had a small snack and water prior to the symptoms.  Denies chest pain or shortness of breath.  Patient notes that she has a headache at this time.  Denies fall or hitting her head.  Notes that she was caught by her preceptor.  Review of Systems  Positive: As per HPI Negative:   Physical Exam  BP 102/73 (BP Location: Left Arm)   Pulse 80   Temp 98.2 F (36.8 C) (Oral)   Resp 16   Ht 5\' 1"  (1.549 m)   Wt 50.8 kg   LMP 04/13/2022 (Within Days)   SpO2 100%   BMI 21.16 kg/m  Gen:   Awake, no distress   Resp:  Normal effort  MSK:   Moves extremities without difficulty  Other:    Medical Decision Making  Medically screening exam initiated at 8:29 PM.  Appropriate orders placed.  04/15/2022 was informed that the remainder of the evaluation will be completed by another provider, this initial triage assessment does not replace that evaluation, and the importance of remaining in the ED until their evaluation is complete.  Work-up initiated   Hyden Soley A, PA-C 05/15/22 2031

## 2022-05-15 NOTE — Discharge Instructions (Addendum)
Lab work and exam were reassuring.  Please remember to stay hydrated.    Follow-up PCP as needed  Come back to the emergency department if you develop chest pain, shortness of breath, severe abdominal pain, uncontrolled nausea, vomiting, diarrhea.

## 2022-05-15 NOTE — ED Provider Notes (Signed)
[facility]EM[facility]ENCY [facility] Provider Note   CSN: [id number] Arrival date & time: 05/15/22  2013     History  Chief Complaint  Patient presents with   Loss of Consciousness    Madison Garcia is a 21 y.o. female.  HPI  History of migraines presents with complaint of a syncopal episode.  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  States that she fell and hit the floor, states that she woke up and was slightly disoriented but denies biting her tongue or become incontinent, states that she has no complaints at this time, this never happened in the past, no cardiac history, no seizure history, she is having no other complaints at this time.  This event happened at 7 PM today.    Home Medications Prior to Admission medications   Medication Sig Start Date End Date Taking? Authorizing Provider  [employer] D3 25 MCG (1000 UT) capsule Take 1,000 Units by mouth daily. 04/16/22  Yes [provider]  JUNEL 1/20 1-20 MG-MCG tablet Take 1 tablet by mouth daily. Patient not taking: Reported on 05/15/2022 04/03/22   [provider]  metroNIDAZOLE (FLAGYL) 500 MG tablet Take 1 tablet (500 mg total) by mouth 2 (two) times daily. Patient not taking: Reported on 05/15/2022 04/22/22   Ellsworth Lennox, PA-C      Allergies    Patient has no known allergies.    Review of Systems   Review of Systems  Constitutional:  Negative for chills and fever.  Respiratory:  Negative for shortness of breath.   Cardiovascular:  Negative for chest pain.  Gastrointestinal:  Negative for abdominal pain.  Neurological:  Positive for syncope. Negative for headaches.    Physical Exam Updated Vital Signs BP 109/60   Pulse 87   Temp 98.2 F (36.8 C) (Oral)   Resp 16    Ht 5\' 1"  (1.549 m)   Wt 50.8 kg   LMP 04/13/2022 (Within Days)   SpO2 100%   BMI 21.16 kg/m  Physical Exam Vitals and nursing note reviewed.  Constitutional:      General: She is not in acute distress.    Appearance: She is not ill-appearing.  HENT:     Head: Normocephalic and atraumatic.     Comments: No deformity of the head present no raccoon eyes or battle sign noted.    Nose: No congestion.     Mouth/Throat:     Mouth: Mucous membranes are moist.     Pharynx: Oropharynx is clear.     Comments: No trismus no torticollis no oral trauma specifically no tongue biting Eyes:     Extraocular Movements: Extraocular movements intact.     Conjunctiva/sclera: Conjunctivae normal.     Pupils: Pupils are equal, round, and reactive to light.  Cardiovascular:     Rate and Rhythm: Normal rate and regular rhythm.     Pulses: Normal pulses.     Heart sounds: No murmur heard.    No friction rub. No gallop.  Pulmonary:     Effort: No respiratory distress.     Breath sounds: No wheezing, rhonchi or rales.  Musculoskeletal:     Comments: Spine was palpated nontender to palpation no step-off deformities noted.  Skin:    General:  Skin is warm and dry.  Neurological:     Mental Status: She is alert.     GCS: GCS eye subscore is 4. GCS verbal subscore is 5. GCS motor subscore is 6.     Cranial Nerves: Cranial nerves 2-12 are intact. No cranial nerve deficit.     Motor: Motor function is intact.     Coordination: Romberg sign negative. Finger-Nose-Finger Test normal.     Gait: Gait is intact.     Comments: Cranial nerves II through XII grossly intact no difficulty with word finding, following two-step commands, no unilateral weakness present.    Psychiatric:        Mood and Affect: Mood normal.     ED Results / Procedures / Treatments   Labs (all labs ordered are listed, but only abnormal results are displayed) Labs Reviewed  BASIC METABOLIC PANEL - Abnormal; Notable for the  following components:      Result Value   Sodium 134 (*)    CO2 21 (*)    All other components within normal limits  CBC WITH DIFFERENTIAL/PLATELET - Abnormal; Notable for the following components:   WBC 14.4 (*)    Neutro Abs 12.1 (*)    All other components within normal limits  CBG MONITORING, ED - Abnormal; Notable for the following components:   Glucose-Capillary 126 (*)    All other components within normal limits  I-STAT BETA HCG BLOOD, ED ([facility], [facility], [facility] ONLY)    EKG EKG Interpretation  Date/Time:  Tuesday May 15 2022 20:23:53 EDT Ventricular Rate:  78 PR Interval:  119 QRS Duration: 90 QT Interval:  354 QTC Calculation: 404 R Axis:   50 Text Interpretation: Sinus rhythm Borderline short PR interval Low voltage, precordial leads RSR' in V1 or V2, right VCD or RVH No old tracing to compare Confirmed by Dione Booze ([id number]) on 05/15/2022 11:40:38 PM  Radiology No results found.  Procedures Procedures    Medications Ordered in ED Medications - No data to display  ED Course/ Medical Decision Making/ A&P                           Medical Decision Making  This patient presents to the ED for concern of syncope, this involves an extensive number of treatment options, and is a complaint that carries with it a high risk of complications and morbidity.  The differential diagnosis includes arrhythmia, PE, seizure, CVA    Additional history obtained:  Additional history obtained from mother at bedside External records from outside source obtained and reviewed including previous neurology notes   Co morbidities that complicate the patient evaluation  N/A  Social Determinants of Health:  N/A    Lab Tests:  I Ordered, and personally interpreted labs.  The pertinent results include: CBC shows slight leukocytosis of 14.4, BMP shows sodium 134 CO2 of 21 glucose 126   Imaging Studies ordered:  I ordered imaging studies including N/A I independently visualized  and interpreted imaging which showed N/A I agree with the radiologist interpretation   Cardiac Monitoring:  The patient was maintained on a cardiac monitor.  I personally viewed and interpreted the cardiac monitored which showed an underlying rhythm of: EKG without signs of ischemia   Medicines ordered and prescription drug management:  I ordered medication including N/A I have reviewed the patients home medicines and have made adjustments as needed  Critical Interventions:  N/A   Reevaluation:  Patient patient was  assessed, updated on lab work, she had benign physical exam, no complaints, in agreement with plan and discharge at this time.   Consultations Obtained:  N/A    Test Considered:  CT head-deferred as my suspicion for intracranial head bleed is low at this time, no focal deficits, not on anticoag's.     Rule out low suspicion for CVA or intracranial head bleed as patient denies change in vision, paresthesias or weakness to upper lower extremities, no neuro deficits noted on exam.  Low suspicion for ACS or arrhythmias as patient denies chest pain, shortness of breath.  EKG sinus without signs of ischemia.  Low suspicion for PE as patient is PERC negative.  Low suspicion for seizure as presentation atypical of etiology, there is no tongue biting no urinary incontinence, no postictal state.  Low suspicion for systemic infection as patient is nontoxic-appearing, vital signs reassuring, no obvious source infection noted on exam.       Dispostion and problem list  After consideration of the diagnostic results and the patients response to treatment, I feel that the patent would benefit from discharge.  Syncope-suspect vasovagal, recommend staying hydrated, following with PCP for further evaluation and strict return precautions.            Final Clinical Impression(s) / ED Diagnoses Final diagnoses:  Syncope and collapse    Rx / DC Orders ED Discharge  Orders     None         Barnie Del 05/15/22 2343    Dione Booze, MD 05/16/22 [phone]

## 2022-06-12 ENCOUNTER — Ambulatory Visit (HOSPITAL_COMMUNITY): Payer: Medicaid Other

## 2022-06-20 ENCOUNTER — Ambulatory Visit (HOSPITAL_COMMUNITY)
Admission: RE | Admit: 2022-06-20 | Discharge: 2022-06-20 | Disposition: A | Discharge status: Home / Self Care | Payer: Medicaid Other | Source: Ambulatory Visit | Attending: Family Medicine | Admitting: Family Medicine

## 2022-06-20 ENCOUNTER — Encounter (HOSPITAL_COMMUNITY): Payer: Self-pay

## 2022-06-20 ENCOUNTER — Other Ambulatory Visit: Payer: Self-pay

## 2022-06-20 VITALS — BP 105/75 | HR 77 | Temp 98.0°F | Resp 16

## 2022-06-20 DIAGNOSIS — A5602 Chlamydial vulvovaginitis: Secondary | ICD-10-CM | POA: Insufficient documentation

## 2022-06-20 DIAGNOSIS — J029 Acute pharyngitis, unspecified: Secondary | ICD-10-CM | POA: Insufficient documentation

## 2022-06-20 DIAGNOSIS — R300 Dysuria: Secondary | ICD-10-CM | POA: Diagnosis not present

## 2022-06-20 LAB — POCT RAPID STREP A, ED / UC: Streptococcus, Group A Screen (Direct): NEGATIVE

## 2022-06-20 LAB — POCT URINALYSIS DIPSTICK, ED / UC
Glucose, UA: NEGATIVE mg/dL
Ketones, ur: 40 mg/dL — AB
Leukocytes,Ua: NEGATIVE
Nitrite: NEGATIVE
Protein, ur: 30 mg/dL — AB
Specific Gravity, Urine: >=1.03 (ref 1.005–1.030)
Urobilinogen, UA: 1 mg/dL (ref 0.0–1.0)
pH: 5.5 (ref 5.0–8.0)

## 2022-06-20 LAB — POC URINE PREG, ED: Preg Test, Ur: NEGATIVE

## 2022-06-20 LAB — POCT INFECTIOUS MONO SCREEN, ED / UC: Mono Screen: NEGATIVE

## 2022-06-20 MED ORDER — CEFDINIR 300 MG PO CAPS: 300.0000 mg | ORAL_CAPSULE | Freq: Two times a day (BID) | ORAL | 0 refills | Status: DC

## 2022-06-20 MED ORDER — FLUCONAZOLE 150 MG PO TABS: ORAL_TABLET | ORAL | 0 refills | Status: DC

## 2022-06-20 NOTE — ED Notes (Signed)
Specimens in lab: throat swab, urine and cyto

## 2022-06-20 NOTE — ED Triage Notes (Signed)
Patient requests uti and std testing while she is here.  Reports slight burning with urination

## 2022-06-20 NOTE — ED Triage Notes (Signed)
Patient started sore throat Monday.  Complains of difficulty swallowing.  Denies any other symptoms.  Patient had strep as a child frequently

## 2022-06-20 NOTE — ED Provider Notes (Signed)
[facility]   [id number] 06/20/22 Arrival Time: 1403  ASSESSMENT & PLAN:  1. Sore throat   2. Dysuria    Labs Reviewed  POCT URINALYSIS DIPSTICK, ED / UC - Abnormal; Notable for the following components:      Result Value   Bilirubin Urine SMALL (*)    Ketones, ur 40 (*)    Hgb urine dipstick LARGE (*)    Protein, ur 30 (*)    All other components within normal limits  URINE CULTURE  POCT RAPID STREP A, ED / UC  POCT INFECTIOUS MONO SCREEN, ED / UC  POC URINE PREG, ED  CERVICOVAGINAL ANCILLARY ONLY   Monospot negative. Rapid strep negative but exam is consistent with strep throat. With hematuria on U/A.  Below should cover both throat and urine. Urine culture and vaginal cytology pending. With reported vaginal itching will tx for yeast infection also.  Meds ordered this encounter  Medications   cefdinir (OMNICEF) 300 MG capsule    Sig: Take 1 capsule (300 mg total) by mouth 2 (two) times daily.    Dispense:  20 capsule    Refill:  0   fluconazole (DIFLUCAN) 150 MG tablet    Sig: Take one tablet by mouth as a single dose. May repeat in 3 days if symptoms persist.    Dispense:  2 tablet    Refill:  0      Discharge Instructions      In addition to a urine culture, we have sent testing for various causes of vaginal infectins. We will notify you of any positive results once they are received. If required, we will prescribe any medications you might need.       Labs Reviewed  POCT URINALYSIS DIPSTICK, ED / UC - Abnormal; Notable for the following components:      Result Value   Bilirubin Urine SMALL (*)    Ketones, ur 40 (*)    Hgb urine dipstick LARGE (*)    Protein, ur 30 (*)    All other components within normal limits  URINE CULTURE  POCT RAPID STREP A, ED / UC  POCT INFECTIOUS MONO SCREEN, ED / UC  POC URINE PREG, ED  CERVICOVAGINAL ANCILLARY ONLY    Will notify of any significant results. Instructed to refrain from sexual activity for at  least seven days.  Reviewed expectations re: course of current medical issues. Questions answered. Outlined signs and symptoms indicating need for more acute intervention. Patient verbalized understanding. After Visit Summary given.   SUBJECTIVE:  Madison Garcia is a 21 y.o. female who presents with complaint of ST; few days; subj fever. Also with mild dysuria x few days. Also with vaginal itching. No specific vaginal discharge. Denies: urinary frequency and gross hematuria. Afebrile. No abdominal or pelvic pain. Normal PO intake wihout n/v. No genital rashes or lesions. Reports that she is sexually actibev OTC treatment: none.  Patient's last menstrual period was 06/17/2022.   OBJECTIVE:  Vitals:   06/20/22 1520  BP: 105/75  Pulse: 77  Resp: 16  Temp: 98 F (36.7 C)  TempSrc: Oral    General appearance: alert, cooperative, appears stated age and no distress Throat: with bilateral exudative tonsil hypertrophy Neck: small cervical LAD bilaterally that is TTP Lungs: unlabored respirations; speaks full sentences without difficulty Back: no CVA tenderness; FROM at waist Abdomen: soft, non-tender GU: deferred Skin: warm and dry Psychological: alert and cooperative; normal mood and affect.  Results for orders placed or performed  during the hospital encounter of 06/20/22  POC Urinalysis dipstick  Result Value Ref Range   Glucose, UA NEGATIVE NEGATIVE mg/dL   Bilirubin Urine SMALL (A) NEGATIVE   Ketones, ur 40 (A) NEGATIVE mg/dL   Specific Gravity, Urine >=1.030 1.005 - 1.030   Hgb urine dipstick LARGE (A) NEGATIVE   pH 5.5 5.0 - 8.0   Protein, ur 30 (A) NEGATIVE mg/dL   Urobilinogen, UA 1.0 0.0 - 1.0 mg/dL   Nitrite NEGATIVE NEGATIVE   Leukocytes,Ua NEGATIVE NEGATIVE  POCT Rapid Strep A  Result Value Ref Range   Streptococcus, Group A Screen (Direct) NEGATIVE NEGATIVE  POCT Infectious Mono Screen  Result Value Ref Range   Mono Screen NEGATIVE NEGATIVE  POC urine  preg, ED (not at [facility])  Result Value Ref Range   Preg Test, Ur NEGATIVE NEGATIVE    Labs Reviewed  POCT URINALYSIS DIPSTICK, ED / UC - Abnormal; Notable for the following components:      Result Value   Bilirubin Urine SMALL (*)    Ketones, ur 40 (*)    Hgb urine dipstick LARGE (*)    Protein, ur 30 (*)    All other components within normal limits  URINE CULTURE  POCT RAPID STREP A, ED / UC  POCT INFECTIOUS MONO SCREEN, ED / UC  POC URINE PREG, ED  CERVICOVAGINAL ANCILLARY ONLY    No Known Allergies  Past Medical History:  Diagnosis Date   Headache    Urinary tract infection    Family History  Problem Relation Age of Onset   Migraines Maternal Grandmother    Migraines Other    Autism Other    Social History   Socioeconomic History   Marital status: Single    Spouse name: Not on file   Number of children: Not on file   Years of education: Not on file   Highest education level: Not on file  Occupational History   Not on file  Tobacco Use   Smoking status: Never   Smokeless tobacco: Never  Vaping Use   Vaping Use: Never used  Substance and Sexual Activity   Alcohol use: No   Drug use: No   Sexual activity: Yes    Birth control/protection: Condom  Other Topics Concern   Not on file  Social History Narrative   Dinia is a 12th grade student.   She attends [employer].   She lives with her dad. She has 6 siblings.   She enjoys watching tv, music, and her phone.   Social Determinants of Health   Financial Resource Strain: Not on file  Food Insecurity: Not on file  Transportation Needs: Not on file  Physical Activity: Not on file  Stress: Not on file  Social Connections: Not on file  Intimate Partner Violence: Not on file           Mardella Layman, MD 06/20/22 1745

## 2022-06-20 NOTE — Discharge Instructions (Addendum)
In addition to a urine culture, we have sent testing for various causes of vaginal infectins. We will notify you of any positive results once they are received. If required, we will prescribe any medications you might need.

## 2022-06-21 ENCOUNTER — Telehealth (HOSPITAL_COMMUNITY): Payer: Self-pay | Admitting: Emergency Medicine

## 2022-06-21 LAB — CERVICOVAGINAL ANCILLARY ONLY
Bacterial Vaginitis (gardnerella): POSITIVE — AB
Candida Glabrata: NEGATIVE
Candida Vaginitis: NEGATIVE
Chlamydia: POSITIVE — AB
Comment: NEGATIVE
Comment: NEGATIVE
Comment: NEGATIVE
Comment: NEGATIVE
Comment: NEGATIVE
Comment: NORMAL
Neisseria Gonorrhea: NEGATIVE
Trichomonas: NEGATIVE

## 2022-06-21 LAB — URINE CULTURE: Culture: <10000 — AB

## 2022-06-21 MED ORDER — DOXYCYCLINE HYCLATE 100 MG PO CAPS: 100.0000 mg | ORAL_CAPSULE | Freq: Two times a day (BID) | ORAL | 0 refills | Status: AC

## 2022-06-21 MED ORDER — METRONIDAZOLE 500 MG PO TABS: 500.0000 mg | ORAL_TABLET | Freq: Two times a day (BID) | ORAL | 0 refills | Status: DC

## 2022-07-04 ENCOUNTER — Ambulatory Visit
Admission: RE | Admit: 2022-07-04 | Discharge: 2022-07-04 | Disposition: A | Discharge status: Home / Self Care | Payer: Medicaid Other | Source: Ambulatory Visit | Attending: Urgent Care | Admitting: Urgent Care

## 2022-07-04 VITALS — BP 86/52 | HR 72 | Temp 98.8°F | Resp 14

## 2022-07-04 DIAGNOSIS — B3731 Acute candidiasis of vulva and vagina: Secondary | ICD-10-CM | POA: Insufficient documentation

## 2022-07-04 DIAGNOSIS — Z8619 Personal history of other infectious and parasitic diseases: Secondary | ICD-10-CM | POA: Diagnosis present

## 2022-07-04 MED ORDER — FLUCONAZOLE 150 MG PO TABS
150.0000 mg | ORAL_TABLET | ORAL | 0 refills | Status: DC
Start: 2022-07-04 — End: 2022-07-25

## 2022-07-04 NOTE — ED Provider Notes (Signed)
[facility]  Note:  This document was prepared using Systems analyst and may include unintentional dictation errors.  MRN: [id number] DOB: 12-08-2000  Subjective:   Madison Garcia is a 21 y.o. female presenting for recheck on persistent white vaginal discharge, vaginal itching and irritation.  Patient recently tested positive for bacterial vaginosis and chlamydia.  Reports that she just finished taking doxycycline.  She also finished fluconazole before she could get through the doxycycline.  No dysuria, urinary frequency or hematuria.  Patient would like a complete recheck using the vaginal swab.  No current facility-administered medications for this encounter.  Current Outpatient Medications:    [facility] D3 25 MCG (1000 UT) capsule, Take 1,000 Units by mouth daily., Disp: , Rfl:    fluconazole (DIFLUCAN) 150 MG tablet, Take one tablet by mouth as a single dose. May repeat in 3 days if symptoms persist., Disp: 2 tablet, Rfl: 0   JUNEL 1/20 1-20 MG-MCG tablet, Take 1 tablet by mouth daily. (Patient not taking: Reported on 05/15/2022), Disp: , Rfl:    metroNIDAZOLE (FLAGYL) 500 MG tablet, Take 1 tablet (500 mg total) by mouth 2 (two) times daily., Disp: 14 tablet, Rfl: 0   No Known Allergies  Past Medical History:  Diagnosis Date   Headache    Urinary tract infection      History reviewed. No pertinent surgical history.  Family History  Problem Relation Age of Onset   Migraines Maternal Grandmother    Migraines Other    Autism Other     Social History   Tobacco Use   Smoking status: Never   Smokeless tobacco: Never  Vaping Use   Vaping Use: Never used  Substance Use Topics   Alcohol use: No   Drug use: No    ROS   Objective:   Vitals: BP (!) 86/52 (BP Location: Left Arm)   Pulse 72   Temp 98.8 F (37.1 C) (Oral)   Resp 14   LMP 06/17/2022   SpO2 98%   Physical Exam Constitutional:      General: She is not in acute  distress.    Appearance: Normal appearance. She is well-developed. She is not ill-appearing, toxic-appearing or diaphoretic.  HENT:     Head: Normocephalic and atraumatic.     Nose: Nose normal.     Mouth/Throat:     Mouth: Mucous membranes are moist.  Eyes:     General: No scleral icterus.       Right eye: No discharge.        Left eye: No discharge.     Extraocular Movements: Extraocular movements intact.     Conjunctiva/sclera: Conjunctivae normal.  Cardiovascular:     Rate and Rhythm: Normal rate.  Pulmonary:     Effort: Pulmonary effort is normal.  Abdominal:     General: Bowel sounds are normal. There is no distension.     Palpations: Abdomen is soft. There is no mass.     Tenderness: There is no abdominal tenderness. There is no right CVA tenderness, left CVA tenderness, guarding or rebound.  Skin:    General: Skin is warm and dry.  Neurological:     General: No focal deficit present.     Mental Status: She is alert and oriented to person, place, and time.  Psychiatric:        Mood and Affect: Mood normal.        Behavior: Behavior normal.  Thought Content: Thought content normal.        Judgment: Judgment normal.     Assessment and Plan :   PDMP not reviewed this encounter.  1. Yeast vaginitis   2. History of chlamydia    Discussed the timelines and meaning of the results for STI tests.  Patient wanted to proceed anyhow.  Recommended using oral fluconazole to help treat her yeast vaginitis. Counseled patient on potential for adverse effects with medications prescribed/recommended today, ER and return-to-clinic precautions discussed, patient verbalized understanding.    Wallis Bamberg, [place] 07/04/22 1707

## 2022-07-04 NOTE — ED Triage Notes (Signed)
Pt recently finished antibiotics for STDs. Reports still having white vaginal discharge and itching. Wants to be re-tested for STDs. Denies pain or urinary problems.

## 2022-07-05 LAB — CERVICOVAGINAL ANCILLARY ONLY
Bacterial Vaginitis (gardnerella): NEGATIVE
Candida Glabrata: NEGATIVE
Candida Vaginitis: POSITIVE — AB
Chlamydia: POSITIVE — AB
Comment: NEGATIVE
Comment: NEGATIVE
Comment: NEGATIVE
Comment: NEGATIVE
Comment: NEGATIVE
Comment: NORMAL
Neisseria Gonorrhea: NEGATIVE
Trichomonas: NEGATIVE

## 2022-07-06 ENCOUNTER — Telehealth (HOSPITAL_COMMUNITY): Payer: Self-pay

## 2022-07-06 MED ORDER — DOXYCYCLINE HYCLATE 100 MG PO CAPS: 100.0000 mg | ORAL_CAPSULE | Freq: Two times a day (BID) | ORAL | 0 refills | Status: DC

## 2022-07-25 ENCOUNTER — Ambulatory Visit
Admission: RE | Admit: 2022-07-25 | Discharge: 2022-07-25 | Disposition: A | Discharge status: Home / Self Care | Payer: Medicaid Other | Source: Ambulatory Visit | Attending: Emergency Medicine | Admitting: Emergency Medicine

## 2022-07-25 VITALS — BP 108/69 | HR 66 | Temp 97.8°F | Resp 16

## 2022-07-25 DIAGNOSIS — N760 Acute vaginitis: Secondary | ICD-10-CM | POA: Insufficient documentation

## 2022-07-25 DIAGNOSIS — Z113 Encounter for screening for infections with a predominantly sexual mode of transmission: Secondary | ICD-10-CM | POA: Insufficient documentation

## 2022-07-25 DIAGNOSIS — Z8619 Personal history of other infectious and parasitic diseases: Secondary | ICD-10-CM | POA: Diagnosis not present

## 2022-07-25 LAB — POCT URINALYSIS DIP (MANUAL ENTRY)
Bilirubin, UA: NEGATIVE
Blood, UA: NEGATIVE
Glucose, UA: NEGATIVE mg/dL
Ketones, POC UA: NEGATIVE mg/dL
Leukocytes, UA: NEGATIVE
Nitrite, UA: NEGATIVE
Protein Ur, POC: NEGATIVE mg/dL
Spec Grav, UA: 1.02 (ref 1.010–1.025)
Urobilinogen, UA: 0.2 E.U./dL
pH, UA: 7 (ref 5.0–8.0)

## 2022-07-25 LAB — POCT URINE PREGNANCY: Preg Test, Ur: NEGATIVE

## 2022-07-25 MED ORDER — CLOTRIMAZOLE 1 % VA CREA: TOPICAL_CREAM | VAGINAL | 0 refills | Status: DC

## 2022-07-25 NOTE — ED Triage Notes (Signed)
Pt states she was treated for Chlamydia and BV and wants to be retested because she is still having some itching.

## 2022-07-25 NOTE — Discharge Instructions (Signed)
The results of your vaginal swab test which screens for BV, yeast, gonorrhea, chlamydia and trichomonas will be made available to you once it is complete.  This typically takes 3 to 5 days.  Please note that we do not test for herpes virus unless you are having an active lesion concerning for herpes outbreak.  Please abstain from sexual intercourse of any kind, vaginal, oral or anal, until you have received the results of your STD testing.    The results will initially be posted to your MyChart account and, if any of your results are abnormal, you will receive a phone call regarding treatment.  Prescriptions, if any are needed, will be provided for you at your pharmacy.    For comfort, while you are waiting for the result of your vaginal swab test, I have provided you with an external yeast infection cream that you can apply 3-4 times daily for relief of vaginal itching and burning.  I recommend that you abstain from sexual intercourse, tampon use or any other other intravaginal activities while you are waiting on these results and possible treatment.   Your urine pregnancy test today is negative.  The analysis we performed of your urine did not reveal any concerns for urinary tract infection.   Please remember that your body is a [place] and you are the [place].  The only way to prevent transmission of sexually transmitted disease when having sexual intercourse is to use condoms.  Repeat sexually transmitted infections can cause scarring in your fallopian tubes which will interfere with your ability to have children.  Repeat exposures to sexually transmitted diseases can also increase your risk of contracting HPV, the human papilloma virus which causes cervical cancer and genital warts and HIV.   If you have not had complete resolution of your symptoms after completing treatment, please return for repeat evaluation.   Thank you for visiting urgent care today.  I appreciate the opportunity to participate  in your care.

## 2022-07-25 NOTE — ED Provider Notes (Signed)
[facility]    CSN: [id number] Arrival date & time: 07/25/22  1528    HISTORY   Chief Complaint  Patient presents with   Vaginal Itching    Testing for uti - Entered by patient   HPI Madison Garcia is a pleasant, 21 y.o. female who presents to urgent care today. Patient reports testing positive for BV and chlamydia last month.  Patient states she continues to have vaginal itching without vaginal discharge and wants to be retested.  Patient is also requesting a pregnancy test today.  Patient states she would also like to be screened for urinary tract infection.  Patient endorses vaginal itching and vaginal irritation.  Patient denies abnormal odor of urine, burning with urination, blood in urine, increased frequency of urination, increased urge to urinate, sensation of incomplete emptying, suprapubic pain, perineal pain, incontinence of urine, altered mental status, flank pain, left-sided flank pain, right-sided flank pain, fever, chills, malaise, rigors, significant fatigue, abnormal vaginal discharge, dyspareunia, genital lesion(s), known exposure to STD, and possible exposure to STD.     The history is provided by the patient.   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 No pertinent surgical history. OB History   No obstetric history on file.    Home Medications    Prior to Admission medications   Medication Sig Start Date End Date  Taking? Authorizing Provider    Family History Family History  Problem Relation Age of Onset   Migraines Maternal Grandmother    Migraines Other    Autism Other    Social History Social History   Tobacco Use   Smoking status: Never   Smokeless tobacco: Never  Vaping Use   Vaping Use: Never used  Substance Use Topics   Alcohol use: No   Drug use: No   Allergies   Patient has no known allergies.  Review of Systems Review of Systems Pertinent findings revealed after performing a 14 point review of systems has been noted in the history of present illness.  Physical Exam Triage Vital Signs ED Triage Vitals  Enc Vitals Group     BP 08/11/21 0827 (!) 147/82     Pulse Rate 08/11/21 0827 72     Resp 08/11/21 0827 18     Temp 08/11/21 0827 98.3 F (36.8 C)     Temp Source 08/11/21 0827 Oral     SpO2 08/11/21 0827 98 %     Weight --      Height --      Head Circumference --      Peak Flow --      Pain Score 08/11/21 0826 5     Pain Loc --      Pain Edu? --      Excl. in GC? --   No data found.  Updated Vital Signs BP 108/69 (BP Location: Right Arm)   Pulse 66   Temp 97.8 F (36.6 C) (Oral)   Resp 16   LMP  07/21/2022 (Exact Date)   SpO2 97%   Physical Exam Vitals and nursing note reviewed.  Constitutional:      General: She is not in acute distress.    Appearance: Normal appearance. She is not ill-appearing.  HENT:     Head: Normocephalic and atraumatic.  Eyes:     General: Lids are normal.        Right eye: No discharge.        Left eye: No discharge.     Extraocular Movements: Extraocular movements intact.     Conjunctiva/sclera: Conjunctivae normal.     Right eye: Right conjunctiva is not injected.     Left eye: Left conjunctiva is not injected.  Neck:     Trachea: Trachea and phonation normal.  Cardiovascular:     Rate and Rhythm: Normal rate and regular rhythm.     Pulses: Normal pulses.     Heart sounds: Normal heart sounds. No murmur heard.     No friction rub. No gallop.  Pulmonary:     Effort: Pulmonary effort is normal. No accessory muscle usage, prolonged expiration or respiratory distress.     Breath sounds: Normal breath sounds. No stridor, decreased air movement or transmitted upper airway sounds. No decreased breath sounds, wheezing, rhonchi or rales.  Chest:     Chest wall: No tenderness.  Genitourinary:    Comments: Patient politely declines pelvic exam today, patient provided a vaginal swab for testing. Musculoskeletal:        General: Normal range of motion.     Cervical back: Normal range of motion and neck supple. Normal range of motion.  Lymphadenopathy:     Cervical: No cervical adenopathy.  Skin:    General: Skin is warm and dry.     Findings: No erythema or rash.  Neurological:     General: No focal deficit present.     Mental Status: She is alert and oriented to person, place, and time.  Psychiatric:        Mood and Affect: Mood normal.        Behavior: Behavior normal.     Visual Acuity Right Eye Distance:   Left Eye Distance:   Bilateral Distance:    Right Eye Near:   Left Eye Near:    Bilateral Near:     [facility] / Diagnostics / Procedures:     Radiology No results found.  Procedures Procedures (including critical care time) EKG  Pending results:  Labs Reviewed  POCT URINE PREGNANCY  POCT URINALYSIS DIP (MANUAL ENTRY)  CERVICOVAGINAL ANCILLARY ONLY    Medications Ordered in UC: Medications - No data to display  UC Diagnoses / Final Clinical Impressions(s)   I have reviewed the triage vital signs and the nursing notes.  Pertinent labs & imaging results that were available during my care of the patient were reviewed by me and considered in my medical decision making (see chart for details).    Final diagnoses:  Screening examination for STD (sexually transmitted disease)  History of chlamydia infection  Vaginitis and vulvovaginitis    Patient was provided with  Clotrimazole cream applied 3-4 x daily as needed for empiric treatment of presumed vulvovaginal irritation based on the history provided to me today.   STD screening was performed, patient advised that the results be posted to their MyChart and if any of the results are positive, they will be notified by phone, further treatment will be provided as indicated based on results of STD screening. Patient  was advised to abstain from sexual intercourse until that they receive the results of their STD testing.  Patient was also advised to use condoms to protect themselves from STD exposure.   Urinalysis today was normal.   Urine pregnancy test was negative. Return precautions advised.  Drug allergies reviewed, all questions addressed.     ED Prescriptions     Medication Sig Dispense Auth. Provider   clotrimazole (GYNE-LOTRIMIN) 1 % vaginal cream Apply to external vaginal area 3-4 times daily as needed for vulvovaginitis. 45 g Theadora Rama Scales, PA-C      PDMP not reviewed this encounter.  Disposition Upon Discharge:  Condition: stable for discharge home  Patient presented with concern for an acute illness with associated systemic symptoms and significant discomfort requiring urgent management. In my opinion, this is a condition that a prudent lay person (someone who possesses an average knowledge of health and medicine) may potentially expect to result in complications if not addressed urgently such as respiratory distress, impairment of bodily function or dysfunction of bodily organs.   As such, the patient has been evaluated and assessed, work-up was performed and treatment was provided in alignment with urgent care protocols and evidence based medicine.  Patient/parent/caregiver has been advised that the patient may require follow up for further testing and/or treatment if the symptoms continue in spite of treatment, as clinically indicated and appropriate.  Routine symptom specific,  illness specific and/or disease specific instructions were discussed with the patient and/or caregiver at length.  Prevention strategies for avoiding STD exposure were also discussed.  The patient will follow up with their current PCP if and as advised. If the patient does not currently have a PCP we will assist them in obtaining one.   The patient may need specialty follow up if the symptoms continue, in spite of conservative treatment and management, for further workup, evaluation, consultation and treatment as clinically indicated and appropriate.  Patient/parent/caregiver verbalized understanding and agreement of plan as discussed.  All questions were addressed during visit.  Please see discharge instructions below for further details of plan.  Discharge Instructions:   Discharge Instructions      The results of your vaginal swab test which screens for BV, yeast, gonorrhea, chlamydia and trichomonas will be made available to you once it is complete.  This typically takes 3 to 5 days.  Please note that we do not test for herpes virus unless you are having an active lesion concerning for herpes outbreak.  Please abstain from sexual intercourse of any kind, vaginal, oral or anal, until you have received the results of your STD testing.    The results will initially be posted to your MyChart account and, if any of your results are abnormal, you will receive a phone call regarding treatment.  Prescriptions, if any are needed, will be provided for you at your pharmacy.    For comfort, while you are waiting for the result of your vaginal swab test, I have provided you with an external yeast infection cream that you can apply 3-4 times daily for relief of vaginal itching and burning.  I recommend that you abstain from sexual intercourse, tampon use or any other other intravaginal activities while you are waiting on these results and possible treatment.   Your urine pregnancy test today is negative.   The analysis we performed of your urine did not reveal any concerns for urinary tract infection.   Please remember that your body is a [place] and you are  the [place].  The only way to prevent transmission of sexually transmitted disease when having sexual intercourse is to use condoms.  Repeat sexually transmitted infections can cause scarring in your fallopian tubes which will interfere with your ability to have children.  Repeat exposures to sexually transmitted diseases can also increase your risk of contracting HPV, the human papilloma virus which causes cervical cancer and genital warts and HIV.   If you have not had complete resolution of your symptoms after completing treatment, please return for repeat evaluation.   Thank you for visiting urgent care today.  I appreciate the opportunity to participate in your care.        This office note has been dictated using Teaching laboratory technician.  Unfortunately, this method of dictation can sometimes lead to typographical or grammatical errors.  I apologize for your inconvenience in advance if this occurs.  Please do not hesitate to reach out to me if clarification is needed.       Theadora Rama Scales, PA-C 07/25/22 1711

## 2022-07-26 ENCOUNTER — Telehealth (HOSPITAL_COMMUNITY): Payer: Self-pay | Admitting: Emergency Medicine

## 2022-07-26 LAB — CERVICOVAGINAL ANCILLARY ONLY
Bacterial Vaginitis (gardnerella): POSITIVE — AB
Candida Glabrata: NEGATIVE
Candida Vaginitis: NEGATIVE
Chlamydia: NEGATIVE
Comment: NEGATIVE
Comment: NEGATIVE
Comment: NEGATIVE
Comment: NEGATIVE
Comment: NEGATIVE
Comment: NORMAL
Neisseria Gonorrhea: NEGATIVE
Trichomonas: NEGATIVE

## 2022-07-26 MED ORDER — METRONIDAZOLE 500 MG PO TABS: 500.0000 mg | ORAL_TABLET | Freq: Two times a day (BID) | ORAL | 0 refills | Status: DC

## 2022-08-31 ENCOUNTER — Ambulatory Visit
Admission: EM | Admit: 2022-08-31 | Discharge: 2022-08-31 | Disposition: A | Discharge status: Home / Self Care | Payer: Medicaid Other | Attending: Emergency Medicine | Admitting: Emergency Medicine

## 2022-08-31 DIAGNOSIS — J020 Streptococcal pharyngitis: Secondary | ICD-10-CM | POA: Diagnosis not present

## 2022-08-31 LAB — POCT RAPID STREP A (OFFICE): Rapid Strep A Screen: POSITIVE — AB

## 2022-08-31 MED ORDER — CEFDINIR 300 MG PO CAPS: 300.0000 mg | ORAL_CAPSULE | Freq: Two times a day (BID) | ORAL | 0 refills | Status: DC

## 2022-08-31 NOTE — ED Provider Notes (Signed)
[facility]    CSN: [id number] Arrival date & time: 08/31/22  1508    HISTORY   Chief Complaint  Patient presents with   Sore Throat   HPI Madison Garcia is a pleasant, 21 y.o. female who presents to urgent care today. Patient complains of a sore throat and swollen tonsils that began 2 days ago.  Patient states she has been taking Tylenol as well as cold and flu medication.  Patient states she has not had relief of her symptoms.  Patient states she has been having a really bad headache since this started and has been sleeping a lot.  Patient denies known sick contacts.  Patient denies possible exposure to STD.   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 No pertinent surgical history. OB History   No obstetric history on file.    Home Medications    Prior to Admission medications   Medication Sig Start Date End Date Taking? Authorizing Provider  cefdinir (OMNICEF) 300 MG capsule Take 1 capsule (300 mg total) by mouth 2 (two) times daily for 10 days. 08/31/22 09/10/22 Yes Theadora Rama Scales, PA-C    Family History Family History  Problem Relation Age of Onset   Migraines Maternal Grandmother    Migraines Other    Autism Other    Social History Social History   Tobacco Use   Smoking status: Never   Smokeless tobacco: Never  Vaping Use   Vaping Use: Never used  Substance Use Topics   Alcohol use: No    Drug use: No   Allergies   Patient has no known allergies.  Review of Systems Review of Systems Pertinent findings revealed after performing a 14 point review of systems has been noted in the history of present illness.  Physical Exam Triage Vital Signs ED Triage Vitals  Enc Vitals Group     BP 08/11/21 0827 (!) 147/82     Pulse Rate 08/11/21 0827 72     Resp 08/11/21 0827 18     Temp 08/11/21 0827 98.3 F (36.8 C)     Temp Source 08/11/21 0827 Oral     SpO2 08/11/21 0827 98 %     Weight --      Height --      Head Circumference --      Peak Flow --      Pain Score 08/11/21 0826 5     Pain Loc --      Pain Edu? --      Excl. in GC? --   No data found.  Updated Vital Signs BP 110/68 (BP Location: Left Arm)   Pulse 78   Temp 98.9 F (37.2 C) (Oral)   Resp 16   LMP 08/21/2022   SpO2 96%   Physical Exam Constitutional:      General: She is not in acute distress.    Appearance: She is well-developed. She is ill-appearing. She is not toxic-appearing.  HENT:  Head: Normocephalic and atraumatic.     Salivary Glands: Right salivary gland is diffusely enlarged and tender. Left salivary gland is diffusely enlarged and tender.     Right Ear: Hearing and external ear normal.     Left Ear: Hearing and external ear normal.     Ears:     Comments: Bilateral EACs with mild erythema, bilateral TMs are normal    Nose: No mucosal edema, congestion or rhinorrhea.     Right Turbinates: Not enlarged, swollen or pale.     Left Turbinates: Not enlarged or swollen.     Right Sinus: No maxillary sinus tenderness or frontal sinus tenderness.     Left Sinus: No maxillary sinus tenderness or frontal sinus tenderness.     Mouth/Throat:     Lips: Pink. No lesions.     Mouth: Mucous membranes are moist. No oral lesions or angioedema.     Dentition: No gingival swelling.     Tongue: No lesions.     Palate: No mass.     Pharynx: Uvula midline. Pharyngeal swelling, oropharyngeal exudate  and posterior oropharyngeal erythema present. No uvula swelling.     Tonsils: Tonsillar exudate present. 2+ on the right. 2+ on the left.  Eyes:     Extraocular Movements: Extraocular movements intact.     Conjunctiva/sclera: Conjunctivae normal.     Pupils: Pupils are equal, round, and reactive to light.  Neck:     Thyroid: No thyroid mass, thyromegaly or thyroid tenderness.     Trachea: Tracheal tenderness present. No abnormal tracheal secretions or tracheal deviation.     Comments: Voice is muffled Cardiovascular:     Rate and Rhythm: Normal rate and regular rhythm.     Pulses: Normal pulses.     Heart sounds: Normal heart sounds, S1 normal and S2 normal. No murmur heard.    No friction rub. No gallop.  Pulmonary:     Effort: Pulmonary effort is normal. No accessory muscle usage, prolonged expiration, respiratory distress or retractions.     Breath sounds: No stridor, decreased air movement or transmitted upper airway sounds. No decreased breath sounds, wheezing, rhonchi or rales.  Abdominal:     General: Bowel sounds are normal.     Palpations: Abdomen is soft.     Tenderness: There is generalized abdominal tenderness. There is no right CVA tenderness, left CVA tenderness or rebound. Negative signs include Murphy's sign.     Hernia: No hernia is present.  Musculoskeletal:        General: No tenderness. Normal range of motion.     Cervical back: Full passive range of motion without pain, normal range of motion and neck supple.     Right lower leg: No edema.     Left lower leg: No edema.  Lymphadenopathy:     Cervical: Cervical adenopathy present.     Right cervical: Superficial cervical adenopathy present.     Left cervical: Superficial cervical adenopathy present.  Skin:    General: Skin is warm and dry.     Findings: No erythema, lesion or rash.  Neurological:     General: No focal deficit present.     Mental Status: She is alert and oriented to person, place, and time.  Mental status is at baseline.  Psychiatric:        Mood and Affect: Mood normal.        Behavior: Behavior normal.        Thought Content: Thought content normal.  Judgment: Judgment normal.     Visual Acuity Right Eye Distance:   Left Eye Distance:   Bilateral Distance:    Right Eye Near:   Left Eye Near:    Bilateral Near:     UC Couse / Diagnostics / Procedures:     Radiology No results found.  Procedures Procedures (including critical care time) EKG  Pending results:  Labs Reviewed  POCT RAPID STREP A (OFFICE) - Abnormal; Notable for the following components:      Result Value   Rapid Strep A Screen Positive (*)    All other components within normal limits    Medications Ordered in UC: Medications - No data to display  UC Diagnoses / Final Clinical Impressions(s)   I have reviewed the triage vital signs and the nursing notes.  Pertinent labs & imaging results that were available during my care of the patient were reviewed by me and considered in my medical decision making (see chart for details).    Final diagnoses:  Acute streptococcal pharyngitis   Rapid strep test today is weakly positive.  We will treat empirically for presumed streptococcal pharyngitis based on physical exam findings and strep test findings.  Patient provided with a 10-day course of cefdinir.  Patient given strict return precautions that if she is not any better 3 days for now that she should return for mono testing.  Patient advised to use ibuprofen as needed for throat pain and swelling.  ED Prescriptions     Medication Sig Dispense Auth. Provider   cefdinir (OMNICEF) 300 MG capsule Take 1 capsule (300 mg total) by mouth 2 (two) times daily for 10 days. 20 capsule Theadora Rama Scales, PA-C      PDMP not reviewed this encounter.  Disposition Upon Discharge:  Condition: stable for discharge home Home: take medications as prescribed; routine discharge instructions as  discussed; follow up as advised.  Patient presented with an acute illness with associated systemic symptoms and significant discomfort requiring urgent management. In my opinion, this is a condition that a prudent lay person (someone who possesses an average knowledge of health and medicine) may potentially expect to result in complications if not addressed urgently such as respiratory distress, impairment of bodily function or dysfunction of bodily organs.   Routine symptom specific, illness specific and/or disease specific instructions were discussed with the patient and/or caregiver at length.   As such, the patient has been evaluated and assessed, work-up was performed and treatment was provided in alignment with urgent care protocols and evidence based medicine.  Patient/parent/caregiver has been advised that the patient may require follow up for further testing and treatment if the symptoms continue in spite of treatment, as clinically indicated and appropriate.  If the patient was tested for COVID-19, Influenza and/or RSV, then the patient/parent/guardian was advised to isolate at home pending the results of his/her diagnostic coronavirus test and potentially longer if they're positive. I have also advised pt that if his/her COVID-19 test returns positive, it's recommended to self-isolate for at least 10 days after symptoms first appeared AND until fever-free for 24 hours without fever reducer AND other symptoms have improved or resolved. Discussed self-isolation recommendations as well as instructions for household member/close contacts as per the [facility] and [place] DHHS, and also gave patient the COVID packet with this information.  Patient/parent/caregiver has been advised to return to the [facility] or PCP in 3-5 days if no better; to PCP or the Emergency Department if new signs and symptoms develop,  or if the current signs or symptoms continue to change or worsen for further workup, evaluation and treatment  as clinically indicated and appropriate  The patient will follow up with their current PCP if and as advised. If the patient does not currently have a PCP we will assist them in obtaining one.   The patient may need specialty follow up if the symptoms continue, in spite of conservative treatment and management, for further workup, evaluation, consultation and treatment as clinically indicated and appropriate.  Patient/parent/caregiver verbalized understanding and agreement of plan as discussed.  All questions were addressed during visit.  Please see discharge instructions below for further details of plan.  Discharge Instructions:   Discharge Instructions      Your strep test today is positive.  I recommend that you begin antibiotics now for treatment.  I have sent a prescription to your pharmacy.  Please take them as prescribed.  You will begin to feel better in about 24 hours.  Please be sure that you you finish the entire 10-day course of treatment to avoid worsening infection that may require longer treatment with stronger antibiotics.   After 24 hours of taking antibiotics, you are no longer contagious.  Please discard your toothbrush as well as any other oral devices that you are currently using and replace them with new ones to avoid reinfection.   Please read below to learn more about the medications, dosages and frequencies that I recommend to help alleviate your symptoms and to get you feeling better soon:   Omnicef (cefdinir):  1 capsule twice daily for 10 days, you can take it with or without food.  This antibiotic can cause upset stomach, this will resolve once antibiotics are complete.  You are welcome to use a probiotic, eat yogurt, take Imodium while taking this medication.  Please avoid other systemic medications such as Maalox, Pepto-Bismol or milk of magnesia as they can interfere with your body's ability to absorb the antibiotics.       Advil, Motrin (ibuprofen): This is a  good anti-inflammatory medication which addresses aches, pains and inflammation of the upper airways that causes sinus and nasal congestion as well as in the lower airways which makes your cough feel tight and sometimes burn.  I recommend that you take between 400 to 600 mg every 6-8 hours as needed.      Please follow-up within the next 7-10 days either with your primary care provider or urgent care if your symptoms do not resolve.  If you do not have a primary care provider, we will assist you in finding one.        Thank you for visiting urgent care today.  We appreciate the opportunity to participate in your care.       This office note has been dictated using Teaching laboratory technician.  Unfortunately, this method of dictation can sometimes lead to typographical or grammatical errors.  I apologize for your inconvenience in advance if this occurs.  Please do not hesitate to reach out to me if clarification is needed.      Theadora Rama Scales, PA-C 08/31/22 [phone]

## 2022-08-31 NOTE — ED Triage Notes (Signed)
Pt c/o sore throat that began 2 days ago.  Home interventions: tylenol, cold and flu medication

## 2022-08-31 NOTE — Discharge Instructions (Signed)
Your strep test today is positive.  I recommend that you begin antibiotics now for treatment.  I have sent a prescription to your pharmacy.  Please take them as prescribed.  You will begin to feel better in about 24 hours.  Please be sure that you you finish the entire 10-day course of treatment to avoid worsening infection that may require longer treatment with stronger antibiotics.   After 24 hours of taking antibiotics, you are no longer contagious.  Please discard your toothbrush as well as any other oral devices that you are currently using and replace them with new ones to avoid reinfection.   Please read below to learn more about the medications, dosages and frequencies that I recommend to help alleviate your symptoms and to get you feeling better soon:   Omnicef (cefdinir):  1 capsule twice daily for 10 days, you can take it with or without food.  This antibiotic can cause upset stomach, this will resolve once antibiotics are complete.  You are welcome to use a probiotic, eat yogurt, take Imodium while taking this medication.  Please avoid other systemic medications such as Maalox, Pepto-Bismol or milk of magnesia as they can interfere with your body's ability to absorb the antibiotics.   Advil, Motrin (ibuprofen): This is a good anti-inflammatory medication which addresses aches, pains and inflammation of the upper airways that causes sinus and nasal congestion as well as in the lower airways which makes your cough feel tight and sometimes burn.  I recommend that you take between 400 to 600 mg every 6-8 hours as needed.      Please follow-up within the next 7-10 days either with your primary care provider or urgent care if your symptoms do not resolve.  If you do not have a primary care provider, we will assist you in finding one.        Thank you for visiting urgent care today.  We appreciate the opportunity to participate in your care.   

## 2022-09-04 ENCOUNTER — Ambulatory Visit (HOSPITAL_COMMUNITY)
Admission: RE | Admit: 2022-09-04 | Discharge: 2022-09-04 | Disposition: A | Discharge status: Home / Self Care | Payer: Medicaid Other | Source: Ambulatory Visit | Attending: Nurse Practitioner | Admitting: Nurse Practitioner

## 2022-09-04 ENCOUNTER — Encounter (HOSPITAL_COMMUNITY): Payer: Self-pay

## 2022-09-04 VITALS — BP 110/74 | HR 78 | Temp 98.0°F | Resp 17

## 2022-09-04 DIAGNOSIS — N760 Acute vaginitis: Secondary | ICD-10-CM | POA: Insufficient documentation

## 2022-09-04 LAB — POCT URINALYSIS DIPSTICK, ED / UC
Bilirubin Urine: NEGATIVE
Glucose, UA: NEGATIVE mg/dL
Hgb urine dipstick: NEGATIVE
Nitrite: NEGATIVE
Protein, ur: NEGATIVE mg/dL
Specific Gravity, Urine: 1.025 (ref 1.005–1.030)
Urobilinogen, UA: 0.2 mg/dL (ref 0.0–1.0)
pH: 6 (ref 5.0–8.0)

## 2022-09-04 LAB — HIV ANTIBODY (ROUTINE TESTING W REFLEX): HIV Screen 4th Generation wRfx: NONREACTIVE

## 2022-09-04 NOTE — Discharge Instructions (Addendum)
The urinalysis today shows a little bit of white blood cells but no bacteria, we are sending off for a urine culture and will call you in a couple of days if this shows we need to treat with antibiotics. I do not think you have a UTI  We we will call you within the next 24 to 48 hours with any positive results from the vaginal swab or blood work.

## 2022-09-04 NOTE — ED Provider Notes (Signed)
[facility]    CSN: [id number] Arrival date & time: 09/04/22  1240      History   Chief Complaint Chief Complaint  Patient presents with   Vaginal Itching    Entered by patient    HPI Madison Garcia is a 21 y.o. female.   Patient presents for vaginal itching, vaginal discharge that she describes as both slimy and thin and thick/20.  No vaginal odor.  No vaginal sores, lesions, or open areas on her skin.  No swelling in her groin, abdominal pain, dysuria/urinary frequency, urinary urgency, urinary incontinence.  No hematuria.  Patient denies fevers as well.  Reports she desires STI screening today.    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 No pertinent surgical history.  OB History   No obstetric history on file.      Home Medications    Prior to Admission medications   Not on File    Family History Family History  Problem Relation Age of Onset   Migraines Maternal Grandmother    Migraines Other    Autism Other     Social History Social History   Tobacco Use   Smoking status: Never   Smokeless tobacco: Never  Vaping Use   Vaping Use: Never used  Substance Use Topics   Alcohol use: No   Drug use: No     Allergies   Patient has no known allergies.   Review of Systems Review of Systems Per HPI  Physical Exam Triage Vital Signs ED Triage Vitals  Enc Vitals Group     BP 09/04/22 1305 110/74      Pulse Rate 09/04/22 1305 78     Resp 09/04/22 1305 17     Temp 09/04/22 1305 98 F (36.7 C)     Temp Source 09/04/22 1305 Oral     SpO2 09/04/22 1305 96 %     Weight --      Height --      Head Circumference --      Peak Flow --      Pain Score 09/04/22 1304 0     Pain Loc --      Pain Edu? --      Excl. in GC? --    No data found.  Updated Vital Signs BP 110/74 (BP Location: Left Arm)   Pulse 78   Temp 98 F (36.7 C) (Oral)   Resp 17   LMP 08/21/2022   SpO2 96%   Visual Acuity Right Eye Distance:   Left Eye Distance:   Bilateral Distance:    Right Eye Near:   Left Eye Near:    Bilateral Near:     Physical Exam Vitals and nursing note reviewed.  Constitutional:      General: She is not in acute distress.    Appearance: Normal appearance. She is not toxic-appearing.  Pulmonary:     Effort: Pulmonary effort is normal. No respiratory distress.  Genitourinary:  Comments: Deferred - self swab performed Skin:    General: Skin is warm and dry.     Coloration: Skin is not jaundiced or pale.     Findings: No erythema.  Neurological:     Mental Status: She is alert and oriented to person, place, and time.     Motor: No weakness.     Gait: Gait normal.  Psychiatric:        Behavior: Behavior is cooperative.      UC Treatments / Results  Labs (all labs ordered are listed, but only abnormal results are displayed) Labs Reviewed  POCT URINALYSIS DIPSTICK, ED / UC - Abnormal; Notable for the following components:      Result Value   Ketones, ur TRACE (*)    Leukocytes,Ua SMALL (*)    All other components within normal limits  URINE CULTURE  RPR  HIV ANTIBODY (ROUTINE TESTING W REFLEX)  CERVICOVAGINAL ANCILLARY ONLY    EKG   Radiology No results found.  Procedures Procedures (including critical care time)  Medications Ordered in UC Medications - No data to display  Initial Impression / Assessment and Plan / UC Course  I have reviewed the triage  vital signs and the nursing notes.  Pertinent labs & imaging results that were available during my care of the patient were reviewed by me and considered in my medical decision making (see chart for details).   Patient is well-appearing, normotensive, afebrile, not tachycardic, not tachypneic, oxygenating well on room air.    Acute vaginitis Self swab obtained to check for yeast vaginitis, bacterial vaginosis, chlamydia, trichomonas, gonorrhea.  Treat as indicated. Urinalysis is unrevealing for nitrites, however given small moderate leukocytes, will send for urine culture to confirm no UTI.  She is not having any symptoms of UTI, will defer treatment until urine culture returns. HIV and syphilis testing also obtained Supportive measures discussed, vaginal hygiene discussed Recommended condom use with every sexual encounter  The patient was given the opportunity to ask questions.  All questions answered to their satisfaction.  The patient is in agreement to this plan.   Final Clinical Impressions(s) / UC Diagnoses   Final diagnoses:  Acute vaginitis     Discharge Instructions      The urinalysis today shows a little bit of white blood cells but no bacteria, we are sending off for a urine culture and will call you in a couple of days if this shows we need to treat with antibiotics. I do not think you have a UTI  We we will call you within the next 24 to 48 hours with any positive results from the vaginal swab or blood work.     ED Prescriptions   None    PDMP not reviewed this encounter.   Valentino Nose, NP 09/04/22 1415

## 2022-09-04 NOTE — ED Triage Notes (Signed)
Pt reports white vaginal discharge and vaginal itching  x 1 week .  Requesting STD testing and check for UTI. Denies urinary symptoms

## 2022-09-05 LAB — CERVICOVAGINAL ANCILLARY ONLY
Bacterial Vaginitis (gardnerella): NEGATIVE
Candida Glabrata: NEGATIVE
Candida Vaginitis: POSITIVE — AB
Chlamydia: NEGATIVE
Comment: NEGATIVE
Comment: NEGATIVE
Comment: NEGATIVE
Comment: NEGATIVE
Comment: NEGATIVE
Comment: NORMAL
Neisseria Gonorrhea: NEGATIVE
Trichomonas: NEGATIVE

## 2022-09-05 LAB — URINE CULTURE: Culture: NO GROWTH

## 2022-09-05 LAB — RPR: RPR Ser Ql: NONREACTIVE

## 2022-09-07 ENCOUNTER — Telehealth (HOSPITAL_COMMUNITY): Payer: Self-pay

## 2022-09-07 MED ORDER — FLUCONAZOLE 150 MG PO TABS: ORAL_TABLET | ORAL | 0 refills | Status: DC

## 2022-09-07 NOTE — Telephone Encounter (Signed)
Pt positive for yeast. Sending Diflucan to pharmacy of choice.

## 2022-11-15 ENCOUNTER — Encounter (INDEPENDENT_AMBULATORY_CARE_PROVIDER_SITE_OTHER): Payer: Self-pay

## 2023-02-04 ENCOUNTER — Ambulatory Visit
Admission: EM | Admit: 2023-02-04 | Discharge: 2023-02-04 | Disposition: A | Discharge status: Home / Self Care | Payer: Medicaid Other | Attending: Urgent Care | Admitting: Urgent Care

## 2023-02-04 DIAGNOSIS — R350 Frequency of micturition: Secondary | ICD-10-CM | POA: Insufficient documentation

## 2023-02-04 DIAGNOSIS — R8290 Unspecified abnormal findings in urine: Secondary | ICD-10-CM | POA: Diagnosis not present

## 2023-02-04 DIAGNOSIS — Z7251 High risk heterosexual behavior: Secondary | ICD-10-CM | POA: Insufficient documentation

## 2023-02-04 LAB — POCT URINALYSIS DIP (MANUAL ENTRY)
Bilirubin, UA: NEGATIVE
Glucose, UA: NEGATIVE mg/dL
Ketones, POC UA: NEGATIVE mg/dL
Leukocytes, UA: NEGATIVE
Nitrite, UA: NEGATIVE
Protein Ur, POC: NEGATIVE mg/dL
Spec Grav, UA: >=1.03 — AB (ref 1.010–1.025)
Urobilinogen, UA: 0.2 E.U./dL
pH, UA: 6 (ref 5.0–8.0)

## 2023-02-04 LAB — POCT URINE PREGNANCY: Preg Test, Ur: NEGATIVE

## 2023-02-04 NOTE — ED Triage Notes (Signed)
Pt reports urine has a bad smell x 3-4 days. Pt requested urine test and STD's test.

## 2023-02-04 NOTE — ED Provider Notes (Signed)
[facility]  Note:  This document was prepared using Conservation officer, historic buildings and may include unintentional dictation errors.  MRN: [id number] DOB: 11/28/2000  Subjective:   KADE RICKELS is a 22 y.o. female presenting for 4 day history of urinary frequency, urgency, malodorous urine. Has unprotected sex with 1 female partner. Denies fever, n/v, abdominal pain, pelvic pain, rashes, dysuria, hematuria, vaginal discharge.  Drinks primarily water.   No current facility-administered medications for this encounter.  Current Outpatient Medications:    JUNEL 1/20 1-20 MG-MCG tablet, Take 1 tablet by mouth daily., Disp: , Rfl:    fluconazole (DIFLUCAN) 150 MG tablet, Take one tablet ( ).  If you are still experiencing symptoms three days later, you may take the second tablet (150 mg)., Disp: 2 tablet, Rfl: 0   No Known Allergies  Past Medical History:  Diagnosis Date   Headache    Urinary tract infection      History reviewed. No pertinent surgical history.  Family History  Problem Relation Age of Onset   Migraines Maternal Grandmother    Migraines Other    Autism Other     Social History   Tobacco Use   Smoking status: Never   Smokeless tobacco: Never  Vaping Use   Vaping Use: Never used  Substance Use Topics   Alcohol use: Yes    Comment: Sometimes   Drug use: Never    ROS   Objective:   Vitals: BP 103/65 (BP Location: Left Arm)   Pulse 74   Temp 98.9 F (37.2 C) (Oral)   Resp 14   SpO2 97%   Physical Exam Constitutional:      General: She is not in acute distress.    Appearance: Normal appearance. She is well-developed. She is not ill-appearing, toxic-appearing or diaphoretic.  HENT:     Head: Normocephalic and atraumatic.     Nose: Nose normal.     Mouth/Throat:     Mouth: Mucous membranes are moist.     Pharynx: Oropharynx is clear.  Eyes:     General: No scleral icterus.       Right eye: No discharge.         Left eye: No discharge.     Extraocular Movements: Extraocular movements intact.     Conjunctiva/sclera: Conjunctivae normal.  Cardiovascular:     Rate and Rhythm: Normal rate.  Pulmonary:     Effort: Pulmonary effort is normal.  Abdominal:     General: Bowel sounds are normal. There is no distension.     Palpations: Abdomen is soft. There is no mass.     Tenderness: There is no abdominal tenderness. There is no right CVA tenderness, left CVA tenderness, guarding or rebound.  Skin:    General: Skin is warm and dry.  Neurological:     General: No focal deficit present.     Mental Status: She is alert and oriented to person, place, and time.  Psychiatric:        Mood and Affect: Mood normal.        Behavior: Behavior normal.        Thought Content: Thought content normal.        Judgment: Judgment normal.     Results for orders placed or performed during the hospital encounter of 02/04/23 (from the past 24 hour(s))  POCT urinalysis dipstick     Status: Abnormal   Collection Time: 02/04/23  1:52 PM  Result Value Ref Range  Color, UA yellow yellow   Clarity, UA clear clear   Glucose, UA negative negative mg/dL   Bilirubin, UA negative negative   Ketones, POC UA negative negative mg/dL   Spec Grav, UA >=4.098 (A) 1.010 - 1.025   Blood, UA small (A) negative   pH, UA 6.0 5.0 - 8.0   Protein Ur, POC negative negative mg/dL   Urobilinogen, UA 0.2 0.2 or 1.0 E.U./dL   Nitrite, UA Negative Negative   Leukocytes, UA Negative Negative  POCT urine pregnancy     Status: None   Collection Time: 02/04/23  1:52 PM  Result Value Ref Range   Preg Test, Ur Negative Negative    Assessment and Plan :   PDMP not reviewed this encounter.  1. Urinary frequency   2. Malodorous urine   3. Unprotected sex    Patient declined empiric treatment. Would prefer to await results. Counseled patient on potential for adverse effects with medications prescribed/recommended today, ER and  return-to-clinic precautions discussed, patient verbalized understanding.    Wallis Bamberg, PA-C 02/04/23 1427

## 2023-02-04 NOTE — Discharge Instructions (Addendum)
Make sure you hydrate very well with plain water and a quantity of 64 ounces of water a day.  Please limit drinks that are considered urinary irritants such as soda, sweet tea, coffee, energy drinks, alcohol.  These can worsen your urinary and genital symptoms but also be the source of them.  I will let you know about your vaginal swab and urine culture results through MyChart to see if we need to prescribe or change your antibiotics based off of those results. ° °

## 2023-02-05 LAB — URINE CULTURE: Culture: 4000

## 2023-02-06 LAB — CERVICOVAGINAL ANCILLARY ONLY
Bacterial Vaginitis (gardnerella): POSITIVE — AB
Candida Glabrata: NEGATIVE
Candida Vaginitis: NEGATIVE
Chlamydia: POSITIVE — AB
Comment: NEGATIVE
Comment: NEGATIVE
Comment: NEGATIVE
Comment: NEGATIVE
Comment: NEGATIVE
Comment: NORMAL
Neisseria Gonorrhea: NEGATIVE
Trichomonas: NEGATIVE

## 2023-02-07 ENCOUNTER — Telehealth (HOSPITAL_COMMUNITY): Payer: Self-pay | Admitting: Emergency Medicine

## 2023-02-07 MED ORDER — DOXYCYCLINE HYCLATE 100 MG PO CAPS: 100.0000 mg | ORAL_CAPSULE | Freq: Two times a day (BID) | ORAL | 0 refills | Status: AC

## 2023-02-07 MED ORDER — METRONIDAZOLE 0.75 % VA GEL: 1.0000 | Freq: Every day | VAGINAL | 0 refills | Status: AC

## 2023-02-18 ENCOUNTER — Encounter (INDEPENDENT_AMBULATORY_CARE_PROVIDER_SITE_OTHER): Payer: Self-pay

## 2023-02-22 ENCOUNTER — Encounter (HOSPITAL_COMMUNITY): Payer: Self-pay

## 2023-02-22 ENCOUNTER — Ambulatory Visit (HOSPITAL_COMMUNITY)
Admission: RE | Admit: 2023-02-22 | Discharge: 2023-02-22 | Disposition: A | Discharge status: Home / Self Care | Payer: Medicaid Other | Source: Ambulatory Visit | Attending: Emergency Medicine | Admitting: Emergency Medicine

## 2023-02-22 VITALS — BP 107/68 | HR 77 | Temp 98.8°F | Resp 14

## 2023-02-22 DIAGNOSIS — N760 Acute vaginitis: Secondary | ICD-10-CM | POA: Diagnosis present

## 2023-02-22 MED ORDER — FLUCONAZOLE 150 MG PO TABS: ORAL_TABLET | ORAL | 0 refills | Status: DC

## 2023-02-22 NOTE — ED Triage Notes (Signed)
Patient c/o vaginal itching x 1 week. Patient states she tested positive for chlamydia and had BV. Patient states she completed her antibiotics and states she used the vaginal gel prescribed as well.

## 2023-02-22 NOTE — Discharge Instructions (Addendum)
Overall your symptoms are consistent with yeast vaginitis, please take the fluconazole today.  If you are still experiencing vaginal itching in 3 days, you can take a repeat dose.  We have tested you to ensure that this is yeast, your results will be available via MyChart.   I advise starting a daily probiotic for women's health with lactobacillus in it, this is the active microorganism in the female vagina.  This will help balance your pH.   Please return to clinic for any new or concerning symptoms.

## 2023-02-22 NOTE — ED Provider Notes (Signed)
[facility]    CSN: [id number] Arrival date & time: 02/22/23  0849      History   Chief Complaint Chief Complaint  Patient presents with   Vaginal Itching    HPI Madison Garcia is a 22 y.o. female.   Patient was recently treated with doxycycline and metronidazole gel for BV and chlamydia, started on treatment on 02/07/23.  For the past week the patient reports vaginal itching, reports she is prone to vaginal yeast infections.  Reports compliance with antibiotics, has finished them.  She denies any vaginal sores, lesions, changes in discharge, odor or dysuria.  Afebrile.  Patient has abstained from intercourse.    The history is provided by the patient and medical records.  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 No pertinent surgical history.  OB History   No obstetric history on file.      Home Medications    Prior to Admission medications   Medication Sig Start Date End Date Taking? Authorizing Provider  fluconazole (DIFLUCAN) 150 MG tablet Take one tablet (150mg ).  If you are still experiencing symptoms three days later, you may take the second tablet (150 mg). 02/22/23   Tiani Stanbery, [place] N, FNP  JUNEL 1/20 1-20 MG-MCG tablet Take 1 tablet by mouth daily. 01/23/23   [provider]    Family History Family History  Problem Relation Age of Onset   Migraines Maternal Grandmother     Migraines Other    Autism Other     Social History Social History   Tobacco Use   Smoking status: Never   Smokeless tobacco: Never  Vaping Use   Vaping Use: Never used  Substance Use Topics   Alcohol use: Yes    Comment: Sometimes   Drug use: Never     Allergies   Patient has no known allergies.   Review of Systems Review of Systems  Constitutional:  Negative for fever.  Genitourinary:  Negative for dysuria, flank pain, genital sores, pelvic pain and vaginal discharge.     Physical Exam Triage Vital Signs ED Triage Vitals  Enc Vitals Group     BP 02/22/23 0905 107/68     Pulse Rate 02/22/23 0905 77     Resp 02/22/23 0905 14     Temp 02/22/23 0905 98.8 F (37.1 C)     Temp Source 02/22/23 0905 Oral     SpO2 02/22/23 0905 96 %     Weight --      Height --      Head Circumference --      Peak Flow --      Pain Score 02/22/23 0907 0     Pain Loc --      Pain Edu? --      Excl. in GC? --    No data found.  Updated Vital Signs BP 107/68 (BP Location: Right Arm)   Pulse 77   Temp 98.8 F (  37.1 C) (Oral)   Resp 14   SpO2 96%   Visual Acuity Right Eye Distance:   Left Eye Distance:   Bilateral Distance:    Right Eye Near:   Left Eye Near:    Bilateral Near:     Physical Exam Vitals and nursing note reviewed.  Constitutional:      Appearance: Normal appearance.  HENT:     Head: Normocephalic and atraumatic.     Right Ear: External ear normal.     Left Ear: External ear normal.     Nose: Nose normal.     Mouth/Throat:     Mouth: Mucous membranes are moist.  Eyes:     Conjunctiva/sclera: Conjunctivae normal.  Cardiovascular:     Rate and Rhythm: Normal rate.  Pulmonary:     Effort: Pulmonary effort is normal. No respiratory distress.  Neurological:     General: No focal deficit present.     Mental Status: She is alert and oriented to person, place, and time.  Psychiatric:        Mood and Affect: Mood normal.        Behavior: Behavior  normal.      UC Treatments / Results  Labs (all labs ordered are listed, but only abnormal results are displayed) Labs Reviewed  CERVICOVAGINAL ANCILLARY ONLY    EKG   Radiology No results found.  Procedures Procedures (including critical care time)  Medications Ordered in UC Medications - No data to display  Initial Impression / Assessment and Plan / UC Course  I have reviewed the triage vital signs and the nursing notes.  Pertinent labs & imaging results that were available during my care of the patient were reviewed by me and considered in my medical decision making (see chart for details).  Vitals and triage reviewed, patient is hemodynamically stable.  Recently completed oral antibiotic therapy as well as vaginal suppository antibiotic therapy for BV and chlamydia.  Since then has had vaginal itching, will obtain cytology swab to confirm yeast.  Will treat with Diflucan prophylactically due to symptoms and recent antibiotic use.  Plan of care, follow-up and return precautions reviewed, no questions at this time.     Final Clinical Impressions(s) / UC Diagnoses   Final diagnoses:  Vaginitis and vulvovaginitis     Discharge Instructions      Overall your symptoms are consistent with yeast vaginitis, please take the fluconazole today.  If you are still experiencing vaginal itching in 3 days, you can take a repeat dose.  We have tested you to ensure that this is yeast, your results will be available via MyChart.   I advise starting a daily probiotic for women's health with lactobacillus in it, this is the active microorganism in the female vagina.  This will help balance your pH.   Please return to clinic for any new or concerning symptoms.      ED Prescriptions     Medication Sig Dispense Auth. Provider   fluconazole (DIFLUCAN) 150 MG tablet Take one tablet (150mg ).  If you are still experiencing symptoms three days later, you may take the second tablet (150  mg). 2 tablet Amarra Sawyer, [place] N, FNP      PDMP not reviewed this encounter.   Rinaldo Ratel [place] N, [place] 02/22/23 [id number]

## 2023-02-25 LAB — CERVICOVAGINAL ANCILLARY ONLY
Bacterial Vaginitis (gardnerella): POSITIVE — AB
Candida Glabrata: NEGATIVE
Candida Vaginitis: NEGATIVE
Chlamydia: NEGATIVE
Comment: NEGATIVE
Comment: NEGATIVE
Comment: NEGATIVE
Comment: NEGATIVE
Comment: NEGATIVE
Comment: NORMAL
Neisseria Gonorrhea: NEGATIVE
Trichomonas: NEGATIVE

## 2023-02-26 ENCOUNTER — Telehealth (HOSPITAL_COMMUNITY): Payer: Self-pay | Admitting: Emergency Medicine

## 2023-02-26 MED ORDER — METRONIDAZOLE 0.75 % VA GEL: 1.0000 | Freq: Every day | VAGINAL | 0 refills | Status: AC

## 2023-05-22 ENCOUNTER — Ambulatory Visit
Admission: RE | Admit: 2023-05-22 | Discharge: 2023-05-22 | Disposition: A | Discharge status: Home / Self Care | Payer: Medicaid Other | Source: Ambulatory Visit | Attending: Internal Medicine | Admitting: Internal Medicine

## 2023-05-22 VITALS — BP 103/67 | HR 105 | Temp 98.7°F | Resp 16

## 2023-05-22 DIAGNOSIS — J039 Acute tonsillitis, unspecified: Secondary | ICD-10-CM | POA: Diagnosis not present

## 2023-05-22 LAB — POCT RAPID STREP A (OFFICE): Rapid Strep A Screen: NEGATIVE

## 2023-05-22 MED ORDER — IBUPROFEN 800 MG PO TABS: 800.0000 mg | ORAL_TABLET | Freq: Once | ORAL | Status: DC

## 2023-05-22 MED ORDER — IBUPROFEN 100 MG/5ML PO SUSP
600.0000 mg | Freq: Once | ORAL | Status: AC
  Administered 2023-05-22: 600 mg via ORAL

## 2023-05-22 MED ORDER — AMOXICILLIN 500 MG PO CAPS: 500.0000 mg | ORAL_CAPSULE | Freq: Two times a day (BID) | ORAL | 0 refills | Status: AC

## 2023-05-22 NOTE — ED Provider Notes (Signed)
[facility]    CSN: [id number] Arrival date & time: 05/22/23  1553      History   Chief Complaint Chief Complaint  Patient presents with   Sore Throat    Entered by patient    HPI Madison Garcia is a 22 y.o. female.   Patient presents to urgent care for evaluation of sore throat, bilateral neck tenderness, and chills that started yesterday. Slight headache today, states this is improved from yesterday. No cough, ear pain, N/V/D, rash, abdominal pain, or dizziness. Sore throat is worsened by swallowing and is currently 9/10.  Denies difficulty swallowing, difficulty maintaining secretions, trismus, ear pain, and body aches. Denies recent sick contacts with similar symptoms. She has not attempted use of any OTC medicines PTA.    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 No pertinent surgical history.  OB History   No obstetric history on file.      Home Medications    Prior to Admission medications   Medication Sig Start Date End Date Taking? Authorizing Provider  amoxicillin (AMOXIL) 500 MG capsule Take 1 capsule (500 mg total) by mouth 2 (two) times daily for 10 days. 05/22/23 06/01/23 Yes Carlisle Beers, FNP  fluconazole (DIFLUCAN) 150 MG tablet Take one tablet (150mg ).  If you are still experiencing symptoms three days later, you may take the second tablet (150 mg). 02/22/23   Garrison, [place] N, FNP  JUNEL  1/20 1-20 MG-MCG tablet Take 1 tablet by mouth daily. 01/23/23   [provider]    Family History Family History  Problem Relation Age of Onset   Migraines Maternal Grandmother    Migraines Other    Autism Other     Social History Social History   Tobacco Use   Smoking status: Never   Smokeless tobacco: Never  Vaping Use   Vaping status: Never Used  Substance Use Topics   Alcohol use: Yes    Comment: occ   Drug use: Never     Allergies   Patient has no known allergies.   Review of Systems Review of Systems Per HPI  Physical Exam Triage Vital Signs ED Triage Vitals  Encounter Vitals Group     BP 05/22/23 1606 103/67     Systolic BP Percentile --      Diastolic BP Percentile --      Pulse Rate 05/22/23 1606 (!) 105     Resp 05/22/23 1606 16     Temp 05/22/23 1606 98.7 F (37.1 C)     Temp Source 05/22/23 1606 Oral     SpO2 05/22/23 1606 97 %     Weight --      Height --      Head Circumference --      Peak Flow --      Pain Score 05/22/23 1605 9     Pain Loc --      Pain Education --  Exclude from Growth Chart --    No data found.  Updated Vital Signs BP 103/67 (BP Location: Right Arm)   Pulse (!) 105   Temp 98.7 F (37.1 C) (Oral)   Resp 16   SpO2 97%   Visual Acuity Right Eye Distance:   Left Eye Distance:   Bilateral Distance:    Right Eye Near:   Left Eye Near:    Bilateral Near:     Physical Exam Vitals and nursing note reviewed.  Constitutional:      Appearance: She is not ill-appearing or toxic-appearing.  HENT:     Head: Normocephalic and atraumatic.     Right Ear: Hearing, tympanic membrane, ear canal and external ear normal.     Left Ear: Hearing, tympanic membrane, ear canal and external ear normal.     Nose: Nose normal.     Mouth/Throat:     Lips: Pink.     Mouth: Mucous membranes are moist. No injury.     Tongue: No lesions. Tongue does not deviate from midline.     Palate: No mass and lesions.      Pharynx: Oropharynx is clear. Uvula midline. Posterior oropharyngeal erythema present. No pharyngeal swelling, oropharyngeal exudate or uvula swelling.     Tonsils: Tonsillar exudate (patchy white exudate to bilateral tonsils) present. No tonsillar abscesses. 1+ on the right. 3+ on the left.     Comments: No trismus, phonation normal, maintaining secretions without difficulty.  Eyes:     General: Lids are normal. Vision grossly intact. Gaze aligned appropriately.     Extraocular Movements: Extraocular movements intact.     Conjunctiva/sclera: Conjunctivae normal.  Cardiovascular:     Rate and Rhythm: Regular rhythm. Tachycardia present.     Heart sounds: Normal heart sounds, S1 normal and S2 normal.  Pulmonary:     Effort: Pulmonary effort is normal. No respiratory distress.     Breath sounds: Normal breath sounds and air entry. No wheezing, rhonchi or rales.  Chest:     Chest wall: No tenderness.  Musculoskeletal:     Cervical back: Neck supple.  Lymphadenopathy:     Cervical: Cervical adenopathy present.  Skin:    General: Skin is warm and dry.     Capillary Refill: Capillary refill takes less than 2 seconds.     Findings: No rash.  Neurological:     General: No focal deficit present.     Mental Status: She is alert and oriented to person, place, and time. Mental status is at baseline.     Cranial Nerves: No dysarthria or facial asymmetry.  Psychiatric:        Mood and Affect: Mood normal.        Speech: Speech normal.        Behavior: Behavior normal.        Thought Content: Thought content normal.        Judgment: Judgment normal.      UC Treatments / Results  Labs (all labs ordered are listed, but only abnormal results are displayed) Labs Reviewed  CULTURE, GROUP A STREP [facility])  POCT RAPID STREP A (OFFICE)    EKG   Radiology No results found.  Procedures Procedures (including critical care time)  Medications Ordered in UC Medications  ibuprofen (ADVIL) 100  MG/5ML suspension 600 mg (600 mg Oral Given 05/22/23 1623)    Initial Impression / Assessment and Plan / UC Course  I have reviewed the triage vital signs and the nursing notes.  Pertinent labs & imaging results that were available during my care of the patient were reviewed by me and considered in my medical decision making (see chart for details).   1. Acute tonsillitis, unspecified POC strep negative, throat culture pending.  Exam concerning for bacterial pharyngitis/tonsillitis, therefore will go ahead and treat with amoxicillin BID for 10 days. Ibuprofen/tylenol as needed. No red flag signs/symptoms. No signs of peritonsillar abscess. Chang tooth brush in 2-3 days.  Counseled patient on potential for adverse effects with medications prescribed/recommended today, strict ER and return-to-clinic precautions discussed, patient verbalized understanding.    Final Clinical Impressions(s) / UC Diagnoses   Final diagnoses:  Acute tonsillitis, unspecified etiology     Discharge Instructions      Your strep throat test was negative, however I would like to go ahead and treat you for tonsillitis which is bacterial infection of the throat.  - Take the prescribed antibiotic as directed for the next 10 days. - Take ibuprofen 600 mg every 6 hours as needed for throat pain and fever.  - Change your toothbrush after 2 to 3 days of antibiotic use to prevent reinfection. - You may also gargle with salt water and drink warm tea with honey to soothe your throat.  If you develop any new or worsening symptoms or if your symptoms do not start to improve, please return here or follow-up with your primary care provider. If your symptoms are severe, please go to the emergency room.     ED Prescriptions     Medication Sig Dispense Auth. Provider   amoxicillin (AMOXIL) 500 MG capsule Take 1 capsule (500 mg total) by mouth 2 (two) times daily for 10 days. 20 capsule Carlisle Beers, FNP       PDMP not reviewed this encounter.   Carlisle Beers, [place] 05/22/23 1645

## 2023-05-22 NOTE — ED Triage Notes (Signed)
Pt c/o sore throat x 2 days-denies fever-no pain meds PTA-NAD-steady gait

## 2023-05-22 NOTE — Discharge Instructions (Addendum)
Your strep throat test was negative, however I would like to go ahead and treat you for tonsillitis which is bacterial infection of the throat.  - Take the prescribed antibiotic as directed for the next 10 days. - Take ibuprofen 600 mg every 6 hours as needed for throat pain and fever.  - Change your toothbrush after 2 to 3 days of antibiotic use to prevent reinfection. - You may also gargle with salt water and drink warm tea with honey to soothe your throat.  If you develop any new or worsening symptoms or if your symptoms do not start to improve, please return here or follow-up with your primary care provider. If your symptoms are severe, please go to the emergency room.

## 2023-07-15 ENCOUNTER — Encounter (HOSPITAL_COMMUNITY): Payer: Self-pay

## 2023-07-15 ENCOUNTER — Ambulatory Visit (HOSPITAL_COMMUNITY)
Admission: RE | Admit: 2023-07-15 | Discharge: 2023-07-15 | Disposition: A | Discharge status: Home / Self Care | Payer: Medicaid Other | Source: Ambulatory Visit | Attending: Physician Assistant | Admitting: Physician Assistant

## 2023-07-15 VITALS — BP 110/68 | HR 93 | Temp 97.9°F | Resp 17

## 2023-07-15 DIAGNOSIS — Z3202 Encounter for pregnancy test, result negative: Secondary | ICD-10-CM | POA: Diagnosis not present

## 2023-07-15 DIAGNOSIS — R350 Frequency of micturition: Secondary | ICD-10-CM | POA: Diagnosis not present

## 2023-07-15 DIAGNOSIS — N898 Other specified noninflammatory disorders of vagina: Secondary | ICD-10-CM | POA: Insufficient documentation

## 2023-07-15 DIAGNOSIS — Z113 Encounter for screening for infections with a predominantly sexual mode of transmission: Secondary | ICD-10-CM | POA: Insufficient documentation

## 2023-07-15 LAB — POCT URINALYSIS DIP (MANUAL ENTRY)
Bilirubin, UA: NEGATIVE
Blood, UA: NEGATIVE
Glucose, UA: NEGATIVE mg/dL
Ketones, POC UA: NEGATIVE mg/dL
Leukocytes, UA: NEGATIVE
Nitrite, UA: NEGATIVE
Protein Ur, POC: NEGATIVE mg/dL
Spec Grav, UA: >=1.03 — AB (ref 1.010–1.025)
Urobilinogen, UA: 0.2 U/dL
pH, UA: 6 (ref 5.0–8.0)

## 2023-07-15 LAB — POCT URINE PREGNANCY: Preg Test, Ur: NEGATIVE

## 2023-07-15 LAB — HIV ANTIBODY (ROUTINE TESTING W REFLEX): HIV Screen 4th Generation wRfx: NONREACTIVE

## 2023-07-15 MED ORDER — CLOTRIMAZOLE 1 % VA CREA: 1.0000 | TOPICAL_CREAM | Freq: Every day | VAGINAL | 0 refills | Status: DC

## 2023-07-15 NOTE — ED Provider Notes (Signed)
[facility]    CSN: [id number] Arrival date & time: 07/15/23  1656      History   Chief Complaint Chief Complaint  Patient presents with   Vaginal Itching    Tested for std and uti - Entered by patient    HPI Madison Garcia is a 22 y.o. female.   Patient presents today with a 1 week history of vaginal irritation which she describes as pruritus.  Denies any pelvic pain, abdominal pain, fever, nausea, vomiting.  She denies any vaginal discharge or abnormal bleeding.  She does have some urinary frequency but this is chronic.  Denies any dysuria, hematuria, urinary urgency.  Denies any recent urogenital procedure or self-catheterization.  Denies any recent antibiotics.  She does not believe that she could be pregnant as she has been compliant with her OCP but is requesting a test.  She is sexually active with female partners but does not always use condoms.  She also reports changing her detergent and believes that this could have triggered her symptoms.  She denies history of diabetes does not take SGLT2 inhibitor.    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 No pertinent surgical history.  OB History   No obstetric history on file.      Home Medications    Prior to Admission medications   Medication Sig Start Date End Date Taking? Authorizing Provider  clotrimazole (GYNE-LOTRIMIN) 1 % vaginal cream Place 1 Applicatorful vaginally at  bedtime. 07/15/23  Yes Jaliza Seifried, Noberto Retort, PA-C  JUNEL 1/20 1-20 MG-MCG tablet Take 1 tablet by mouth daily. 01/23/23   [provider]    Family History Family History  Problem Relation Age of Onset   Migraines Maternal Grandmother    Migraines Other    Autism Other     Social History Social History   Tobacco Use   Smoking status: Never   Smokeless tobacco: Never  Vaping Use   Vaping status: Never Used  Substance Use Topics   Alcohol use: Yes    Comment: occ   Drug use: Never     Allergies   Patient has no known allergies.   Review of Systems Review of Systems  Constitutional:  Negative for activity change, appetite change, fatigue and fever.  Gastrointestinal:  Negative for abdominal pain, diarrhea, nausea and vomiting.  Genitourinary:  Positive for frequency and vaginal pain (Irritation). Negative for dysuria, pelvic pain, urgency, vaginal bleeding and vaginal discharge.     Physical Exam Triage Vital Signs ED Triage Vitals  Encounter Vitals Group     BP 07/15/23 1713 110/68     Systolic BP Percentile --      Diastolic BP Percentile --      Pulse Rate 07/15/23 1713 93     Resp 07/15/23 1713 17     Temp 07/15/23 1713 97.9 F (36.6 C)     Temp Source 07/15/23 1713 Oral     SpO2 07/15/23 1713 97 %  Weight --      Height --      Head Circumference --      Peak Flow --      Pain Score 07/15/23 1712 0     Pain Loc --      Pain Education --      Exclude from Growth Chart --    No data found.  Updated Vital Signs BP 110/68 (BP Location: Right Arm)   Pulse 93   Temp 97.9 F (36.6 C) (Oral)   Resp 17   LMP  (LMP Unknown)   SpO2 97%   Visual Acuity Right Eye Distance:   Left Eye Distance:   Bilateral Distance:    Right Eye Near:   Left Eye Near:    Bilateral Near:     Physical Exam Vitals reviewed.  Constitutional:      General: She is awake. She is not in acute distress.    Appearance: Normal appearance. She is well-developed. She is  not ill-appearing.     Comments: Very pleasant female appears stated age in no acute distress sitting comfortably in exam room  HENT:     Head: Normocephalic and atraumatic.  Cardiovascular:     Rate and Rhythm: Normal rate and regular rhythm.     Heart sounds: Normal heart sounds, S1 normal and S2 normal. No murmur heard. Pulmonary:     Effort: Pulmonary effort is normal.     Breath sounds: Normal breath sounds. No wheezing, rhonchi or rales.     Comments: Clear to auscultation bilaterally Abdominal:     General: Bowel sounds are normal.     Palpations: Abdomen is soft.     Tenderness: There is no abdominal tenderness. There is no right CVA tenderness, left CVA tenderness, guarding or rebound.     Comments: Benign abdominal exam  Psychiatric:        Behavior: Behavior is cooperative.      UC Treatments / Results  Labs (all labs ordered are listed, but only abnormal results are displayed) Labs Reviewed  POCT URINALYSIS DIP (MANUAL ENTRY) - Abnormal; Notable for the following components:      Result Value   Clarity, UA cloudy (*)    Spec Grav, UA >=1.030 (*)    All other components within normal limits  RPR  HIV ANTIBODY (ROUTINE TESTING W REFLEX)  POCT URINE PREGNANCY  CERVICOVAGINAL ANCILLARY ONLY    EKG   Radiology No results found.  Procedures Procedures (including critical care time)  Medications Ordered in UC Medications - No data to display  Initial Impression / Assessment and Plan / UC Course  I have reviewed the triage vital signs and the nursing notes.  Pertinent labs & imaging results that were available during my care of the patient were reviewed by me and considered in my medical decision making (see chart for details).     Patient is well-appearing, afebrile, nontoxic, nontachycardic.  Urine pregnancy was negative including today.  Urine was obtained given her urinary frequency but this showed dehydration without any evidence of infection.  She was  encouraged to push fluids.  Given her vaginal irritation will treat with topical clotrimazole.  STI swab was collected and is pending.  We will contact her if any to arrange additional treatment.  HIV and syphilis testing was obtained and is pending.  We will contact her if any of her testing is positive.  She is to wear loosefitting cotton underwear and use hypoallergenic soaps and detergents.  Discussed that if she has any worsening or changing symptoms she needs to be seen immediately.  Strict return precautions given.  Final Clinical Impressions(s) / UC Diagnoses   Final diagnoses:  Vaginal irritation  Urinary frequency  Encounter for pregnancy test with result negative  Screening examination for STI     Discharge Instructions      Your urine pregnancy was negative.  Your urine did show that you are not drinking enough water but there was no evidence of infection.  I am concerned that you might have a vaginal yeast infection given her clinical presentation.  Use clotrimazole vaginal cream.  We will contact you if any of your other testing is positive.  Wear loosefitting cotton underwear and use hypoallergenic soaps and detergents.  If you develop any worsening symptoms including pelvic pain, abdominal pain, fever, nausea, vomiting you need to be seen immediately.     ED Prescriptions     Medication Sig Dispense Auth. Provider   clotrimazole (GYNE-LOTRIMIN) 1 % vaginal cream Place 1 Applicatorful vaginally at bedtime. 45 g Syble Picco K, PA-C      PDMP not reviewed this encounter.   Jeani Hawking, PA-C 07/15/23 1759

## 2023-07-15 NOTE — ED Triage Notes (Signed)
Pt reports vaginal itching x 1 week. Requesting to be tested for a UTI and STD's with blood work.

## 2023-07-15 NOTE — Discharge Instructions (Addendum)
Your urine pregnancy was negative.  Your urine did show that you are not drinking enough water but there was no evidence of infection.  I am concerned that you might have a vaginal yeast infection given her clinical presentation.  Use clotrimazole vaginal cream.  We will contact you if any of your other testing is positive.  Wear loosefitting cotton underwear and use hypoallergenic soaps and detergents.  If you develop any worsening symptoms including pelvic pain, abdominal pain, fever, nausea, vomiting you need to be seen immediately.

## 2023-07-16 LAB — CERVICOVAGINAL ANCILLARY ONLY
Bacterial Vaginitis (gardnerella): NEGATIVE
Candida Glabrata: NEGATIVE
Candida Vaginitis: NEGATIVE
Chlamydia: POSITIVE — AB
Comment: NEGATIVE
Comment: NEGATIVE
Comment: NEGATIVE
Comment: NEGATIVE
Comment: NEGATIVE
Comment: NORMAL
Neisseria Gonorrhea: NEGATIVE
Trichomonas: NEGATIVE

## 2023-07-16 LAB — RPR: RPR Ser Ql: NONREACTIVE

## 2023-07-17 ENCOUNTER — Telehealth: Payer: Self-pay

## 2023-07-17 MED ORDER — DOXYCYCLINE HYCLATE 100 MG PO CAPS: 100.0000 mg | ORAL_CAPSULE | Freq: Two times a day (BID) | ORAL | 0 refills | Status: DC

## 2023-07-17 NOTE — Telephone Encounter (Signed)
Pt requires tx with Doxycycline. Attempted to reach patient x1. LVM. Rx sent to pharmacy on file.

## 2023-09-30 ENCOUNTER — Ambulatory Visit (HOSPITAL_COMMUNITY)
Admission: RE | Admit: 2023-09-30 | Discharge: 2023-09-30 | Disposition: A | Discharge status: Home / Self Care | Payer: Medicaid Other | Source: Ambulatory Visit | Attending: Family Medicine | Admitting: Family Medicine

## 2023-09-30 ENCOUNTER — Encounter (HOSPITAL_COMMUNITY): Payer: Self-pay

## 2023-09-30 VITALS — BP 108/70 | HR 81 | Temp 98.3°F | Resp 16 | Ht 62.0 in | Wt 112.0 lb

## 2023-09-30 DIAGNOSIS — N898 Other specified noninflammatory disorders of vagina: Secondary | ICD-10-CM | POA: Diagnosis present

## 2023-09-30 DIAGNOSIS — Z113 Encounter for screening for infections with a predominantly sexual mode of transmission: Secondary | ICD-10-CM | POA: Diagnosis present

## 2023-09-30 DIAGNOSIS — R350 Frequency of micturition: Secondary | ICD-10-CM | POA: Diagnosis present

## 2023-09-30 LAB — POCT URINALYSIS DIP (MANUAL ENTRY)
Bilirubin, UA: NEGATIVE
Blood, UA: NEGATIVE
Glucose, UA: NEGATIVE mg/dL
Ketones, POC UA: NEGATIVE mg/dL
Nitrite, UA: NEGATIVE
Protein Ur, POC: NEGATIVE mg/dL
Spec Grav, UA: 1.025 (ref 1.010–1.025)
Urobilinogen, UA: 0.2 U/dL
pH, UA: 6.5 (ref 5.0–8.0)

## 2023-09-30 LAB — HIV ANTIBODY (ROUTINE TESTING W REFLEX): HIV Screen 4th Generation wRfx: NONREACTIVE

## 2023-09-30 LAB — RPR: RPR Ser Ql: NONREACTIVE

## 2023-09-30 LAB — POCT URINE PREGNANCY: Preg Test, Ur: NEGATIVE

## 2023-09-30 MED ORDER — FLUCONAZOLE 150 MG PO TABS: ORAL_TABLET | ORAL | 0 refills | Status: DC

## 2023-09-30 NOTE — ED Provider Notes (Signed)
[facility]   [id number] 09/30/23 Arrival Time: 1049  ASSESSMENT & PLAN:  1. Screening for STDs (sexually transmitted diseases)   2. Vaginal itching   3. Urinary frequency    Meds ordered this encounter  Medications   fluconazole (DIFLUCAN) 150 MG tablet    Sig: Take one tablet by mouth as a single dose. May repeat in 3 days if symptoms persist.    Dispense:  2 tablet    Refill:  0   Urine culture and vaginal cytology pending.    Discharge Instructions      In addition to a urine culture, we have sent testing for sexually transmitted infections. We will notify you of any positive results once they are received. If required, we will prescribe any medications you might need.  Please refrain from all sexual activity for at least the next seven days.     Without s/s of PID.  Labs Reviewed  POCT URINALYSIS DIP (MANUAL ENTRY) - Abnormal; Notable for the following components:      Result Value   Leukocytes, UA Trace (*)    All other components within normal limits  URINE CULTURE  RPR  HIV ANTIBODY (ROUTINE TESTING W REFLEX)  POCT URINE PREGNANCY  CERVICOVAGINAL ANCILLARY ONLY    Pending: Unresulted Labs (From admission, onward)     Start     Ordered   09/30/23 1144  RPR  Once,   URGENT        09/30/23 1143   09/30/23 1144  HIV Antibody (routine testing w rflx)  (HIV Antibody (Routine testing w reflex) panel)  Once,   URGENT        09/30/23 1143   09/30/23 1139  Urine Culture  Once,   URGENT       Question:  Indication  Answer:  Urgency/frequency   09/30/23 1138             Will notify of any positive results. Instructed to refrain from sexual activity for at least seven days.  Reviewed expectations re: course of current medical issues. Questions answered. Outlined signs and symptoms indicating need for more acute intervention. Patient verbalized understanding. After Visit Summary given.   SUBJECTIVE:  Madison Garcia is a 22 y.o. female  who presents requesting STD testing. Patient states she has recently had sex with condom breakage. Wanting full panel STD testing.  Patient having vaginal itching and urinary frequency, no pain. Onset 2 days ago.   No LMP recorded (lmp unknown). (Menstrual status: Oral contraceptives).   OBJECTIVE:  Vitals:   09/30/23 1105  BP: 108/70  Pulse: 81  Resp: 16  Temp: 98.3 F (36.8 C)  TempSrc: Oral  SpO2: 98%  Weight: 50.8 kg  Height: 5\' 2"  (1.575 m)     General appearance: alert, cooperative, appears stated age and no distress GU: deferred Skin: warm and dry Psychological: alert and cooperative; normal mood and affect.  Results for orders placed or performed during the hospital encounter of 09/30/23  POC urinalysis dipstick   Collection Time: 09/30/23 11:16 AM  Result Value Ref Range   Color, UA yellow yellow   Clarity, UA clear clear   Glucose, UA negative negative mg/dL   Bilirubin, UA negative negative   Ketones, POC UA negative negative mg/dL   Spec Grav, UA 5.621 3.086 - 1.025   Blood, UA negative negative   pH, UA 6.5 5.0 - 8.0   Protein Ur, POC negative negative mg/dL   Urobilinogen, UA 0.2 0.2 or  1.0 E.U./dL   Nitrite, UA Negative Negative   Leukocytes, UA Trace (A) Negative  POCT urine pregnancy   Collection Time: 09/30/23 11:16 AM  Result Value Ref Range   Preg Test, Ur Negative Negative    Labs Reviewed  POCT URINALYSIS DIP (MANUAL ENTRY) - Abnormal; Notable for the following components:      Result Value   Leukocytes, UA Trace (*)    All other components within normal limits  URINE CULTURE  RPR  HIV ANTIBODY (ROUTINE TESTING W REFLEX)  POCT URINE PREGNANCY  CERVICOVAGINAL ANCILLARY ONLY    No Known Allergies  Past Medical History:  Diagnosis Date   Headache    Urinary tract infection    Family History  Problem Relation Age of Onset   Migraines Maternal Grandmother    Migraines Other    Autism Other    Social History   Socioeconomic  History   Marital status: Single    Spouse name: Not on file   Number of children: Not on file   Years of education: Not on file   Highest education level: Not on file  Occupational History   Not on file  Tobacco Use   Smoking status: Never   Smokeless tobacco: Never  Vaping Use   Vaping status: Never Used  Substance and Sexual Activity   Alcohol use: Yes    Comment: occ   Drug use: Never   Sexual activity: Yes    Birth control/protection: Condom  Other Topics Concern   Not on file  Social History Narrative   Madison Garcia is a 12th grade student.   She attends [employer].   She lives with her dad. She has 6 siblings.   She enjoys watching tv, music, and her phone.   Social Drivers of Corporate investment banker Strain: Not on file  Food Insecurity: Not on file  Transportation Needs: Not on file  Physical Activity: Not on file  Stress: Not on file  Social Connections: Not on file  Intimate Partner Violence: Not on file           Mardella Layman, MD 09/30/23 1150

## 2023-09-30 NOTE — ED Triage Notes (Signed)
Patient requesting STD testing. Patient states she has sex and the condom broke. Wanting full panel STD testing.   Patient having vaginal itching and urinary frequency, no pain. Onset 2 days ago.

## 2023-09-30 NOTE — Discharge Instructions (Signed)
In addition to a urine culture, we have sent testing for sexually transmitted infections. We will notify you of any positive results once they are received. If required, we will prescribe any medications you might need.  Please refrain from all sexual activity for at least the next seven days.  

## 2023-10-01 LAB — CERVICOVAGINAL ANCILLARY ONLY
Bacterial Vaginitis (gardnerella): NEGATIVE
Candida Glabrata: NEGATIVE
Candida Vaginitis: NEGATIVE
Chlamydia: NEGATIVE
Comment: NEGATIVE
Comment: NEGATIVE
Comment: NEGATIVE
Comment: NEGATIVE
Comment: NEGATIVE
Comment: NORMAL
Neisseria Gonorrhea: NEGATIVE
Trichomonas: NEGATIVE

## 2023-10-01 LAB — URINE CULTURE: Culture: NO GROWTH

## 2023-11-02 ENCOUNTER — Ambulatory Visit
Admission: RE | Admit: 2023-11-02 | Discharge: 2023-11-02 | Discharge status: Home / Self Care | Payer: Medicaid Other | Source: Ambulatory Visit | Attending: Family Medicine

## 2023-11-02 VITALS — BP 105/68 | HR 78 | Temp 98.9°F | Resp 14

## 2023-11-02 DIAGNOSIS — N760 Acute vaginitis: Secondary | ICD-10-CM | POA: Insufficient documentation

## 2023-11-02 DIAGNOSIS — R350 Frequency of micturition: Secondary | ICD-10-CM | POA: Insufficient documentation

## 2023-11-02 LAB — POCT URINALYSIS DIP (MANUAL ENTRY)
Bilirubin, UA: NEGATIVE
Blood, UA: NEGATIVE
Glucose, UA: NEGATIVE mg/dL
Ketones, POC UA: NEGATIVE mg/dL
Leukocytes, UA: NEGATIVE
Nitrite, UA: NEGATIVE
Protein Ur, POC: NEGATIVE mg/dL
Spec Grav, UA: 1.025 (ref 1.010–1.025)
Urobilinogen, UA: 1 U/dL
pH, UA: 6.5 (ref 5.0–8.0)

## 2023-11-02 LAB — POCT URINE PREGNANCY: Preg Test, Ur: NEGATIVE

## 2023-11-02 MED ORDER — FLUCONAZOLE 150 MG PO TABS: 150.0000 mg | ORAL_TABLET | ORAL | 0 refills | Status: DC

## 2023-11-02 NOTE — ED Triage Notes (Signed)
Pt reports burning when urinating, increase urinary frequency and vaginal itching  x 1 week.

## 2023-11-02 NOTE — ED Provider Notes (Signed)
[facility]  Note:  This document was prepared using Conservation officer, historic buildings and may include unintentional dictation errors.  MRN: [id number] DOB: 23-Mar-2001  Subjective:   Madison Garcia is a 23 y.o. female presenting for 1 week history of recurrent persistent vaginal burning, vaginal irritation, vaginal itching, dysuria, urinary frequency.  Patient tries to hydrate primarily with water.  Would like STI testing.  Has had yeast infection in the past.  No current facility-administered medications for this encounter.  Current Outpatient Medications:    [employer] D3 25 MCG (1000 UT) capsule, Take 1,000 Units by mouth daily., Disp: , Rfl:    fluconazole (DIFLUCAN) 150 MG tablet, Take one tablet by mouth as a single dose. May repeat in 3 days if symptoms persist., Disp: 2 tablet, Rfl: 0   JUNEL 1/20 1-20 MG-MCG tablet, Take 1 tablet by mouth daily., Disp: , Rfl:    No Known Allergies  Past Medical History:  Diagnosis Date   Headache    Urinary tract infection      History reviewed. No pertinent surgical history.  Family History  Problem Relation Age of Onset   Migraines Maternal Grandmother    Migraines Other    Autism Other     Social History   Tobacco Use   Smoking status: Never   Smokeless tobacco: Never  Vaping Use   Vaping status: Never Used  Substance Use Topics   Alcohol use: Yes    Comment: occ   Drug use: Never    ROS   Objective:   Vitals: BP 105/68 (BP Location: Right Arm)   Pulse 78   Temp 98.9 F (37.2 C) (Oral)   Resp 14   SpO2 98%   Physical Exam Constitutional:      General: She is not in acute distress.    Appearance: Normal appearance. She is well-developed. She is not ill-appearing, toxic-appearing or diaphoretic.  HENT:     Head: Normocephalic and atraumatic.     Nose: Nose normal.     Mouth/Throat:     Mouth: Mucous membranes are moist.  Eyes:     General: No scleral icterus.       Right eye: No  discharge.        Left eye: No discharge.     Extraocular Movements: Extraocular movements intact.     Conjunctiva/sclera: Conjunctivae normal.  Cardiovascular:     Rate and Rhythm: Normal rate.  Pulmonary:     Effort: Pulmonary effort is normal.  Abdominal:     General: Bowel sounds are normal. There is no distension.     Palpations: Abdomen is soft. There is no mass.     Tenderness: There is no abdominal tenderness. There is no right CVA tenderness, left CVA tenderness, guarding or rebound.  Skin:    General: Skin is warm and dry.  Neurological:     General: No focal deficit present.     Mental Status: She is alert and oriented to person, place, and time.  Psychiatric:        Mood and Affect: Mood normal.        Behavior: Behavior normal.        Thought Content: Thought content normal.        Judgment: Judgment normal.     Results for orders placed or performed during the hospital encounter of 11/02/23 (from the past 24 hours)  POCT urinalysis dipstick     Status: None   Collection Time: 11/02/23  1:45 PM  Result Value Ref Range   Color, UA yellow yellow   Clarity, UA clear clear   Glucose, UA negative negative mg/dL   Bilirubin, UA negative negative   Ketones, POC UA negative negative mg/dL   Spec Grav, UA 9.562 1.308 - 1.025   Blood, UA negative negative   pH, UA 6.5 5.0 - 8.0   Protein Ur, POC negative negative mg/dL   Urobilinogen, UA 1.0 0.2 or 1.0 E.U./dL   Nitrite, UA Negative Negative   Leukocytes, UA Negative Negative  POCT urine pregnancy     Status: None   Collection Time: 11/02/23  1:45 PM  Result Value Ref Range   Preg Test, Ur Negative Negative    Assessment and Plan :   PDMP not reviewed this encounter.  1. Acute vaginitis   2. Urinary frequency    Recommend starting fluconazole for an extended course for suspected recurrent acute vaginitis.  Continue to hydrate well.  Urine culture and vaginal cytology pending.  Counseled patient on potential  for adverse effects with medications prescribed/recommended today, ER and return-to-clinic precautions discussed, patient verbalized understanding.    Wallis Bamberg, [place] 11/02/23 [phone]

## 2023-11-02 NOTE — Discharge Instructions (Signed)
Please start fluconazole to address a suspected recurrent yeast infection. Make sure you hydrate very well with plain water and a quantity of 64 ounces of water a day.  Please limit drinks that are considered urinary irritants such as soda, sweet tea, coffee, energy drinks, alcohol.  These can worsen your urinary and genital symptoms but also be the source of them.  I will let you know about your urine culture and vaginal swab results through MyChart to see if we need to prescribe or change your antibiotics based off of those results.

## 2023-11-04 LAB — CERVICOVAGINAL ANCILLARY ONLY
Bacterial Vaginitis (gardnerella): NEGATIVE
Candida Glabrata: NEGATIVE
Candida Vaginitis: NEGATIVE
Chlamydia: NEGATIVE
Comment: NEGATIVE
Comment: NEGATIVE
Comment: NEGATIVE
Comment: NEGATIVE
Comment: NEGATIVE
Comment: NORMAL
Neisseria Gonorrhea: NEGATIVE
Trichomonas: NEGATIVE

## 2023-11-04 LAB — URINE CULTURE: Culture: NO GROWTH

## 2024-01-28 ENCOUNTER — Ambulatory Visit (HOSPITAL_COMMUNITY)
Admission: RE | Admit: 2024-01-28 | Discharge: 2024-01-28 | Disposition: A | Discharge status: Home / Self Care | Source: Ambulatory Visit | Attending: Emergency Medicine | Admitting: Emergency Medicine

## 2024-01-28 ENCOUNTER — Encounter (HOSPITAL_COMMUNITY): Payer: Self-pay

## 2024-01-28 ENCOUNTER — Encounter (HOSPITAL_COMMUNITY): Payer: Self-pay | Admitting: Emergency Medicine

## 2024-01-28 VITALS — BP 114/81 | HR 78 | Temp 98.2°F | Resp 18 | Ht 61.0 in | Wt 116.0 lb

## 2024-01-28 DIAGNOSIS — N760 Acute vaginitis: Secondary | ICD-10-CM | POA: Insufficient documentation

## 2024-01-28 DIAGNOSIS — Z113 Encounter for screening for infections with a predominantly sexual mode of transmission: Secondary | ICD-10-CM | POA: Diagnosis present

## 2024-01-28 LAB — POCT URINALYSIS DIP (MANUAL ENTRY)
Bilirubin, UA: NEGATIVE
Blood, UA: NEGATIVE
Glucose, UA: NEGATIVE mg/dL
Ketones, POC UA: NEGATIVE mg/dL
Nitrite, UA: NEGATIVE
Protein Ur, POC: NEGATIVE mg/dL
Spec Grav, UA: >=1.03 — AB (ref 1.010–1.025)
Urobilinogen, UA: 0.2 U/dL
pH, UA: 5.5 (ref 5.0–8.0)

## 2024-01-28 LAB — POCT URINE PREGNANCY: Preg Test, Ur: NEGATIVE

## 2024-01-28 LAB — HIV ANTIBODY (ROUTINE TESTING W REFLEX): HIV Screen 4th Generation wRfx: NONREACTIVE

## 2024-01-28 MED ORDER — FLUCONAZOLE 150 MG PO TABS
ORAL_TABLET | ORAL | 0 refills | Status: DC
Start: 2024-01-28 — End: 2024-03-04

## 2024-01-28 NOTE — ED Provider Notes (Signed)
 [facility]    CSN: [id number] Arrival date & time: 01/28/24  4098    HISTORY   Chief Complaint  Patient presents with   Vaginal Itching    Entered by patient   HPI Madison Garcia is a pleasant, 23 y.o. female who presents to urgent care today. Pt states that she has some vaginal itching without obvious abnormal vaginal discharge for the past 2 days.  Patient states she has had both BV and vaginal yeast infections in the past, states it feels more like a vaginal yeast infection at this time but has noticed an abnormal odor which she states does not smelling fish.  Patient is also requesting routine STD testing today, denies possible pregnancy, known exposure to STD, burning with urination, increased frequency urination, suprapubic pain, fever, body aches, chills.  The history is provided by the patient.    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 No pertinent surgical history. OB History   No obstetric history on file.    Home Medications    Prior to Admission medications   Medication Sig Start Date End Date Taking? Authorizing Provider  [facility] D3 25 MCG (1000 UT) capsule Take 1,000 Units by mouth daily. 08/14/23   [provider]  fluconazole (DIFLUCAN) 150 MG tablet Take 1 tablet (150 mg total) by mouth every 3 (three) days. 11/02/23   Wallis Bamberg, PA-C   JUNEL 1/20 1-20 MG-MCG tablet Take 1 tablet by mouth daily. 01/23/23   [provider]    Family History Family History  Problem Relation Age of Onset   Migraines Maternal Grandmother    Migraines Other    Autism Other    Social History Social History   Tobacco Use   Smoking status: Never   Smokeless tobacco: Never  Vaping Use   Vaping status: Never Used  Substance Use Topics   Alcohol use: Yes    Comment: occ   Drug use: Never   Allergies   Patient has no known allergies.  Review of Systems Review of Systems Pertinent findings revealed after performing a 14 point review of systems has been noted in the history of present illness.  Physical Exam Vital Signs BP 114/81 (BP Location: Left Arm)   Pulse 78   Temp 98.2 F (36.8 C) (Oral)   Resp 18   Ht 5\' 1"  (1.549 m)   Wt 116 lb (52.6 kg)   SpO2 99%   BMI 21.92 kg/m   No data found.  Physical Exam Vitals and nursing note reviewed.  Constitutional:      General: She is not in acute distress.    Appearance: Normal appearance. She is not ill-appearing.  HENT:     Head: Normocephalic and atraumatic.  Eyes:     General: Lids are normal.        Right eye: No discharge.        Left eye: No discharge.  Extraocular Movements: Extraocular movements intact.     Conjunctiva/sclera: Conjunctivae normal.     Right eye: Right conjunctiva is not injected.     Left eye: Left conjunctiva is not injected.  Neck:     Trachea: Trachea and phonation normal.  Cardiovascular:     Rate and Rhythm: Normal rate and regular rhythm.     Pulses: Normal pulses.     Heart sounds: Normal heart sounds. No murmur heard.    No friction rub. No gallop.  Pulmonary:     Effort: Pulmonary effort is normal. No accessory muscle usage, prolonged expiration or respiratory distress.     Breath sounds: Normal breath sounds. No stridor, decreased air movement or transmitted upper airway sounds. No decreased breath sounds, wheezing,  rhonchi or rales.  Chest:     Chest wall: No tenderness.  Genitourinary:    Comments: Patient politely declines pelvic exam today, patient provided a vaginal swab for testing. Musculoskeletal:        General: Normal range of motion.     Cervical back: Normal range of motion and neck supple. Normal range of motion.  Lymphadenopathy:     Cervical: No cervical adenopathy.  Skin:    General: Skin is warm and dry.     Findings: No erythema or rash.  Neurological:     General: No focal deficit present.     Mental Status: She is alert and oriented to person, place, and time.  Psychiatric:        Mood and Affect: Mood normal.        Behavior: Behavior normal.     Visual Acuity Right Eye Distance:   Left Eye Distance:   Bilateral Distance:    Right Eye Near:   Left Eye Near:    Bilateral Near:     UC Couse / Diagnostics / Procedures:     Radiology No results found.  Procedures Procedures (including critical care time) EKG  Pending results:  Labs Reviewed  POCT URINALYSIS DIP (MANUAL ENTRY) - Abnormal; Notable for the following components:      Result Value   Spec Grav, UA >=1.030 (*)    Leukocytes, UA Trace (*)    All other components within normal limits  RPR  HIV ANTIBODY (ROUTINE TESTING W REFLEX)  POCT URINE PREGNANCY  CERVICOVAGINAL ANCILLARY ONLY    Medications Ordered in UC: Medications - No data to display  UC Diagnoses / Final Clinical Impressions(s)   I have reviewed the triage vital signs and the nursing notes.  Pertinent labs & imaging results that were available during my care of the patient were reviewed by me and considered in my medical decision making (see chart for details).    Final diagnoses:  Screening examination for STD (sexually transmitted disease)  Vulvovaginitis   Patient was provided with Diflucan 150 mg every 3 days x 2 for empiric treatment of presumed vulvovaginal candidiasis based on the history provided to me today. STD  screening was performed, patient advised that the results be posted to their MyChart and if any of the results are positive, they will be notified by phone, further treatment will be provided as indicated based on results of STD screening. Patient was advised to abstain from sexual intercourse until that they receive the results of their STD testing.  Patient was also advised to use condoms to protect themselves from STD exposure. Urinalysis today was normal. Urine pregnancy test was negative. Return precautions advised.  Drug allergies reviewed, all questions addressed.  Please see discharge instructions below for details of plan of care as provided to patient. ED Prescriptions     Medication Sig Dispense Auth. Provider   fluconazole (DIFLUCAN) 150 MG tablet Take 1 tablet today.  Take second tablet 3 days later. 2 tablet Eloise Hake Scales, PA-C      PDMP not reviewed this encounter.  Disposition Upon Discharge:  Condition: stable for discharge home  Patient presented with concern for an acute illness with associated systemic symptoms and significant discomfort requiring urgent management. In my opinion, this is a condition that a prudent lay person (someone who possesses an average knowledge of health and medicine) may potentially expect to result in complications if not addressed urgently such as respiratory distress, impairment of bodily function or dysfunction of bodily organs.   As such, the patient has been evaluated and assessed, work-up was performed and treatment was provided in alignment with urgent care protocols and evidence based medicine.  Patient/parent/caregiver has been advised that the patient may require follow up for further testing and/or treatment if the symptoms continue in spite of treatment, as clinically indicated and appropriate.  Routine symptom specific, illness specific and/or disease specific instructions were discussed with the patient and/or caregiver at  length.  Prevention strategies for avoiding STD exposure were also discussed.  The patient will follow up with their current PCP if and as advised. If the patient does not currently have a PCP we will assist them in obtaining one.   The patient may need specialty follow up if the symptoms continue, in spite of conservative treatment and management, for further workup, evaluation, consultation and treatment as clinically indicated and appropriate.  Patient/parent/caregiver verbalized understanding and agreement of plan as discussed.  All questions were addressed during visit.  Please see discharge instructions below for further details of plan.    Discharge Instructions      The results of your vaginal swab test which screens for BV, yeast, gonorrhea, chlamydia and trichomonas will be posted to your MyChart account once it is complete.  This typically takes 2 to 4 days.  Please abstain from sexual intercourse of any kind, vaginal, oral or anal, until you have received the results of your STD testing.      The results of your HIV and syphilis blood tests will be made available to you once they are complete.  They will initially be posted to your MyChart account which typically takes 2 to 4 days.      If any of your results are abnormal, you will receive a phone call regarding treatment.  Prescriptions, if any are needed, will be provided for you at your pharmacy.      Your urine pregnancy test today is negative.     Our point-of-care analysis of your urine sample today was normal and did not reveal any concern for urinary tract infection.     Based on the symptoms and concerns you shared with me today, I provided you with a prescription for Diflucan.  Please take 1 tablet when you pick up the prescription.  If you are still having symptoms 3 days later, please take the second tablet.    Please remember that there are only 2 ways to prevent transmission of sexually transmitted disease: to not  have sex or to always use condoms when having sex.       If you have not had complete resolution of your symptoms after completing any recommended treatment or if your symptoms worsen, please return  for repeat evaluation.     Thank you for visiting [facility] today.  We appreciate the opportunity to participate in your care.       This office note has been dictated using Teaching laboratory technician.  Unfortunately, this method of dictation can sometimes lead to typographical or grammatical errors.  I apologize for your inconvenience in advance if this occurs.  Please do not hesitate to reach out to me if clarification is needed.       Eloise Hake Scales, PA-C 01/28/24 1137

## 2024-01-28 NOTE — ED Triage Notes (Signed)
 Pt states that she has some vaginal itching. X2 days Pt states that she also wants to get std tested.

## 2024-01-28 NOTE — Discharge Instructions (Addendum)
 The results of your vaginal swab test which screens for BV, yeast, gonorrhea, chlamydia and trichomonas will be posted to your MyChart account once it is complete.  This typically takes 2 to 4 days.  Please abstain from sexual intercourse of any kind, vaginal, oral or anal, until you have received the results of your STD testing.      The results of your HIV and syphilis blood tests will be made available to you once they are complete.  They will initially be posted to your MyChart account which typically takes 2 to 4 days.      If any of your results are abnormal, you will receive a phone call regarding treatment.  Prescriptions, if any are needed, will be provided for you at your pharmacy.      Your urine pregnancy test today is negative.     Our point-of-care analysis of your urine sample today was normal and did not reveal any concern for urinary tract infection.     Based on the symptoms and concerns you shared with me today, I provided you with a prescription for Diflucan.  Please take 1 tablet when you pick up the prescription.  If you are still having symptoms 3 days later, please take the second tablet.    Please remember that there are only 2 ways to prevent transmission of sexually transmitted disease: to not have sex or to always use condoms when having sex.       If you have not had complete resolution of your symptoms after completing any recommended treatment or if your symptoms worsen, please return for repeat evaluation.     Thank you for visiting [facility] today.  We appreciate the opportunity to participate in your care.

## 2024-01-29 ENCOUNTER — Telehealth (HOSPITAL_COMMUNITY): Payer: Self-pay

## 2024-01-29 LAB — CERVICOVAGINAL ANCILLARY ONLY
Bacterial Vaginitis (gardnerella): POSITIVE — AB
Candida Glabrata: NEGATIVE
Candida Vaginitis: NEGATIVE
Chlamydia: NEGATIVE
Comment: NEGATIVE
Comment: NEGATIVE
Comment: NEGATIVE
Comment: NEGATIVE
Comment: NEGATIVE
Comment: NORMAL
Neisseria Gonorrhea: NEGATIVE
Trichomonas: POSITIVE — AB

## 2024-01-29 LAB — RPR: RPR Ser Ql: NONREACTIVE

## 2024-01-29 MED ORDER — METRONIDAZOLE 500 MG PO TABS: 500.0000 mg | ORAL_TABLET | Freq: Two times a day (BID) | ORAL | 0 refills | Status: DC

## 2024-01-29 NOTE — Telephone Encounter (Signed)
 Per protocol, pt requires tx with metronidazole. Reviewed with patient, verified pharmacy, prescription sent.

## 2024-03-04 ENCOUNTER — Other Ambulatory Visit: Payer: Self-pay

## 2024-03-04 ENCOUNTER — Ambulatory Visit
Admission: RE | Admit: 2024-03-04 | Discharge: 2024-03-04 | Disposition: A | Discharge status: Home / Self Care | Source: Ambulatory Visit | Attending: Physician Assistant

## 2024-03-04 VITALS — BP 114/73 | HR 83 | Temp 97.9°F | Resp 16 | Ht 61.0 in | Wt 112.0 lb

## 2024-03-04 DIAGNOSIS — N898 Other specified noninflammatory disorders of vagina: Secondary | ICD-10-CM | POA: Insufficient documentation

## 2024-03-04 LAB — POCT URINALYSIS DIP (MANUAL ENTRY)
Bilirubin, UA: NEGATIVE
Blood, UA: NEGATIVE
Glucose, UA: NEGATIVE mg/dL
Ketones, POC UA: NEGATIVE mg/dL
Nitrite, UA: NEGATIVE
Protein Ur, POC: NEGATIVE mg/dL
Spec Grav, UA: 1.02 (ref 1.010–1.025)
Urobilinogen, UA: 0.2 U/dL
pH, UA: 6.5 (ref 5.0–8.0)

## 2024-03-04 LAB — POCT URINE PREGNANCY: Preg Test, Ur: NEGATIVE

## 2024-03-04 MED ORDER — FLUCONAZOLE 150 MG PO TABS
150.0000 mg | ORAL_TABLET | ORAL | 0 refills | Status: DC | PRN
Start: 2024-03-04 — End: 2024-08-29

## 2024-03-04 NOTE — ED Provider Notes (Signed)
 [facility]    CSN: [id number] Arrival date & time: 03/04/24  1449      History   Chief Complaint Chief Complaint  Patient presents with   Vaginal Itching    Entered by patient    HPI Madison Garcia is a 23 y.o. female.   HPI  She reports concerns for vulvovaginal itching  She states she was seen for similar symptoms about 3 weeks ago and was treated for BV and trich but her symptoms did not fully resolve despite completing medication regimen as directed She reports trying a monostat OTC treatment as well but this did not provide any relief either   Past Medical History:  Diagnosis Date   Headache    Urinary tract infection     Patient Active Problem List   Diagnosis Date Noted   Screening examination for STD (sexually transmitted disease) 01/28/2024   Episodic tension-type headache, not intractable 03/20/2019   Vaginal discharge 03/04/2019   Hematocolpos 09/17/2018   Solitary kidney 09/17/2018   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 No pertinent surgical history.  OB History   No obstetric history on file.      Home Medications    Prior to Admission medications   Medication Sig Start Date End Date Taking? Authorizing Provider  fluconazole  (DIFLUCAN ) 150 MG tablet Take 1 tablet (150 mg total) by mouth every three (3) days as needed. May repeat in 3 days if symptoms not resolved 03/04/24  Yes Inmer Nix E, PA-C  [facility] D3 25 MCG (1000 UT) capsule Take 1,000 Units by mouth daily. 08/14/23   [provider]  JUNEL 1/20 1-20 MG-MCG tablet Take 1 tablet by mouth daily. 01/23/23   [provider]    Family History Family History  Problem Relation Age of Onset   Migraines Maternal  Grandmother    Migraines Other    Autism Other     Social History Social History   Tobacco Use   Smoking status: Never   Smokeless tobacco: Never  Vaping Use   Vaping status: Never Used  Substance Use Topics   Alcohol use: Yes    Comment: occ   Drug use: Never     Allergies   Patient has no known allergies.   Review of Systems Review of Systems  Constitutional:  Negative for chills, fatigue and fever.  Gastrointestinal:  Negative for abdominal pain, diarrhea, nausea and vomiting.  Genitourinary:  Positive for vaginal discharge (white discharge). Negative for dysuria, flank pain and genital sores.  Skin:  Negative for rash.     Physical Exam Triage Vital Signs ED Triage Vitals  Encounter Vitals Group     BP 03/04/24 1455 114/73     Systolic BP Percentile --      Diastolic BP Percentile --      Pulse Rate 03/04/24 1455 83     Resp 03/04/24 1455 16     Temp 03/04/24 1455 97.9 F (36.6 C)     Temp Source 03/04/24 1455 Oral     SpO2 03/04/24 1455 99 %     Weight 03/04/24 1455 112 lb (50.8 kg)     Height 03/04/24 1455 5\' 1"  (1.549 m)     Head Circumference --      Peak  Flow --      Pain Score 03/04/24 1530 0     Pain Loc --      Pain Education --      Exclude from Growth Chart --    No data found.  Updated Vital Signs BP 114/73 (BP Location: Right Arm)   Pulse 83   Temp 97.9 F (36.6 C) (Oral)   Resp 16   Ht 5\' 1"  (1.549 m)   Wt 112 lb (50.8 kg)   SpO2 99%   BMI 21.16 kg/m   Visual Acuity Right Eye Distance:   Left Eye Distance:   Bilateral Distance:    Right Eye Near:   Left Eye Near:    Bilateral Near:     Physical Exam Vitals reviewed.  Constitutional:      General: She is awake.     Appearance: Normal appearance. She is well-developed and well-groomed.  HENT:     Head: Normocephalic and atraumatic.  Eyes:     General: Lids are normal. Gaze aligned appropriately.     Extraocular Movements: Extraocular movements intact.      Conjunctiva/sclera: Conjunctivae normal.  Pulmonary:     Effort: Pulmonary effort is normal.  Neurological:     General: No focal deficit present.     Mental Status: She is alert and oriented to person, place, and time.     GCS: GCS eye subscore is 4. GCS verbal subscore is 5. GCS motor subscore is 6.     Cranial Nerves: No cranial nerve deficit, dysarthria or facial asymmetry.  Psychiatric:        Attention and Perception: Attention and perception normal.        Mood and Affect: Mood and affect normal.        Speech: Speech normal.        Behavior: Behavior normal. Behavior is cooperative.        Thought Content: Thought content normal.        Judgment: Judgment normal.      UC Treatments / Results  Labs (all labs ordered are listed, but only abnormal results are displayed) Labs Reviewed  POCT URINALYSIS DIP (MANUAL ENTRY) - Abnormal; Notable for the following components:      Result Value   Clarity, UA cloudy (*)    Leukocytes, UA Trace (*)    All other components within normal limits  URINE CULTURE  POCT URINE PREGNANCY  CERVICOVAGINAL ANCILLARY ONLY    EKG   Radiology No results found.  Procedures Procedures (including critical care time)  Medications Ordered in UC Medications - No data to display  Initial Impression / Assessment and Plan / UC Course  I have reviewed the triage vital signs and the nursing notes.  Pertinent labs & imaging results that were available during my care of the patient were reviewed by me and considered in my medical decision making (see chart for details).    Suspect vulvovaginal yeast infection secondary to recent Flagyl  use.  Final Clinical Impressions(s) / UC Diagnoses   Final diagnoses:  Vaginal itching  Vaginal discharge   Patient presents today with concerns of vulvovaginal itching that has been ongoing for about 1 to 2 weeks.  She reports that she was previously treated on 01/28/2024 for bacterial vaginosis and  trichomonas.  She reports completing the medication regimen but states that her symptoms did not completely resolve and have continued and persisted to today.  She has tried over-the-counter Monistat but this did not provide relief to  her symptoms.  Urine dip showed trace amounts of leukocytes which I suspect is likely caused by contamination from ongoing vulvovaginal concern.  Will send off urine culture for definitive rule out.  Urine pregnancy test was negative, cervicovaginal swab collected to assess for BV, trichomoniasis, yeast, gonorrhea, chlamydia.  Patient declines HIV and syphilis testing today.    Given patient's symptoms we will send in 2 tablets of Diflucan  as I suspect that she may have vulvovaginal yeast infection secondary to recent Flagyl  use. Results of cervicovaginal swab to dictate further management. Follow-up as needed for persistent or progressing symptoms    Discharge Instructions      You were seen today for vulvovaginal itching and changes to your vaginal discharge.  We collected a cervicovaginal swab that we will assess for gonorrhea, chlamydia, trichomonas, bacterial vaginosis, yeast.    We will keep you updated with these results once they are available.  If any medications are indicated by those test results we will call you and medications will either be sent to the pharmacy on file or you can return to the urgent care for an injection.  It is recommended that you refrain from sexual activity until your test results are negative or until you have completed an appropriate medication regimen as dictated by your test results.  Please use a condom or another barrier method to help prevent STD transmission.  Please make sure that you communicate with your partners regarding your test results should any positive results, about as they will also need to be tested and screened.  Your urine dip testing was notable for a small amount of white blood cells.  This can sometimes be from a  urinary tract infection or it can also be from potential contamination due to what may be occurring in the vulvovaginal area.  I have sent a sample of your urine off for urine culture to assess for bacterial growth.  If medication is indicated by those test results they will be sent to the pharmacy that we have on file.  Please make sure that you are staying well-hydrated and avoid holding your urine for prolonged periods of time.   ED Prescriptions     Medication Sig Dispense Auth. Provider   fluconazole  (DIFLUCAN ) 150 MG tablet Take 1 tablet (150 mg total) by mouth every three (3) days as needed. May repeat in 3 days if symptoms not resolved 2 tablet Missael Ferrari E, PA-C      PDMP not reviewed this encounter.   Woodford Hayward 03/04/24 2042

## 2024-03-04 NOTE — Discharge Instructions (Addendum)
 You were seen today for vulvovaginal itching and changes to your vaginal discharge.  We collected a cervicovaginal swab that we will assess for gonorrhea, chlamydia, trichomonas, bacterial vaginosis, yeast.    We will keep you updated with these results once they are available.  If any medications are indicated by those test results we will call you and medications will either be sent to the pharmacy on file or you can return to the urgent care for an injection.  It is recommended that you refrain from sexual activity until your test results are negative or until you have completed an appropriate medication regimen as dictated by your test results.  Please use a condom or another barrier method to help prevent STD transmission.  Please make sure that you communicate with your partners regarding your test results should any positive results, about as they will also need to be tested and screened.  Your urine dip testing was notable for a small amount of white blood cells.  This can sometimes be from a urinary tract infection or it can also be from potential contamination due to what may be occurring in the vulvovaginal area.  I have sent a sample of your urine off for urine culture to assess for bacterial growth.  If medication is indicated by those test results they will be sent to the pharmacy that we have on file.  Please make sure that you are staying well-hydrated and avoid holding your urine for prolonged periods of time.

## 2024-03-04 NOTE — ED Triage Notes (Signed)
 Pt presents with complaints of vaginal itching x 1-2 weeks. Pt states she was seen previously on 4/15 and was diagnosed with BV and Trichomonas. Pt reports she took the medications called into the pharmacy. Also inserted the Monistat OTC and applied cream with no relief. Pt currently denies pain.

## 2024-03-05 ENCOUNTER — Ambulatory Visit (HOSPITAL_COMMUNITY): Payer: Self-pay

## 2024-03-05 LAB — CERVICOVAGINAL ANCILLARY ONLY
Bacterial Vaginitis (gardnerella): NEGATIVE
Candida Glabrata: NEGATIVE
Candida Vaginitis: POSITIVE — AB
Chlamydia: NEGATIVE
Comment: NEGATIVE
Comment: NEGATIVE
Comment: NEGATIVE
Comment: NEGATIVE
Comment: NEGATIVE
Comment: NORMAL
Neisseria Gonorrhea: NEGATIVE
Trichomonas: NEGATIVE

## 2024-03-06 LAB — URINE CULTURE: Culture: NO GROWTH

## 2024-04-21 ENCOUNTER — Other Ambulatory Visit: Payer: Self-pay

## 2024-04-21 ENCOUNTER — Ambulatory Visit
Admission: RE | Admit: 2024-04-21 | Discharge: 2024-04-21 | Disposition: A | Discharge status: Home / Self Care | Source: Ambulatory Visit | Attending: Family Medicine | Admitting: Family Medicine

## 2024-04-21 VITALS — BP 106/72 | HR 64 | Temp 98.8°F | Resp 16

## 2024-04-21 DIAGNOSIS — Z113 Encounter for screening for infections with a predominantly sexual mode of transmission: Secondary | ICD-10-CM | POA: Insufficient documentation

## 2024-04-21 DIAGNOSIS — N898 Other specified noninflammatory disorders of vagina: Secondary | ICD-10-CM | POA: Insufficient documentation

## 2024-04-21 LAB — POCT URINALYSIS DIP (MANUAL ENTRY)
Bilirubin, UA: NEGATIVE
Glucose, UA: NEGATIVE mg/dL
Ketones, POC UA: NEGATIVE mg/dL
Leukocytes, UA: NEGATIVE
Nitrite, UA: NEGATIVE
Protein Ur, POC: NEGATIVE mg/dL
Spec Grav, UA: 1.025 (ref 1.010–1.025)
Urobilinogen, UA: 0.2 U/dL
pH, UA: 5.5 (ref 5.0–8.0)

## 2024-04-21 LAB — POCT URINE PREGNANCY: Preg Test, Ur: NEGATIVE

## 2024-04-21 NOTE — Discharge Instructions (Addendum)
 The clinic will contact you with results of the testing done today if positive.  Please follow-up with your PCP or gynecologist if your symptoms do not improve.  Hope you feel better soon!

## 2024-04-21 NOTE — ED Provider Notes (Signed)
 [facility]    CSN: [id number] Arrival date & time: 04/21/24  1839      History   Chief Complaint Chief Complaint  Patient presents with   Exposure to STD    Entered by patient    HPI ROYALTY FAKHOURI is a 23 y.o. female presents for vaginal discharge.  Patient reports 1 week of a mildly pruritic slightly malodorous vaginal discharge.  Denies any dysuria, fevers, nausea/vomiting, flank pain.  No known STD exposure but would like screening.  Reports a history of BV and yeast but is not sure if the symptoms are consistent with either of those.  No OTC treatments have been used since onset.  No other concerns at this time.   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 No pertinent surgical history.  OB History   No obstetric history on file.      Home Medications    Prior to Admission medications   Medication Sig Start Date End Date Taking? Authorizing Provider  [facility] D3 25 MCG (1000 UT) capsule Take 1,000 Units by mouth daily. 08/14/23   [provider]  fluconazole  (DIFLUCAN ) 150 MG tablet Take 1 tablet (150 mg total) by mouth every three (3) days as needed. May repeat in 3 days if symptoms not resolved 03/04/24   Mecum, Erin E, PA-C  JUNEL 1/20 1-20 MG-MCG tablet Take 1 tablet by mouth daily. 01/23/23   [provider]     Family History Family History  Problem Relation Age of Onset   Migraines Maternal Grandmother    Migraines Other    Autism Other     Social History Social History   Tobacco Use   Smoking status: Never   Smokeless tobacco: Never  Vaping Use   Vaping status: Never Used  Substance Use Topics   Alcohol use: Yes    Comment: occ   Drug use: Never     Allergies   Patient has no known allergies.   Review of Systems Review of Systems  Genitourinary:  Positive for vaginal discharge.     Physical Exam Triage Vital Signs ED Triage Vitals  Encounter Vitals Group     BP 04/21/24 1847 106/72     Girls Systolic BP Percentile --      Girls Diastolic BP Percentile --      Boys Systolic BP Percentile --      Boys Diastolic BP Percentile --      Pulse Rate 04/21/24 1847 64     Resp 04/21/24 1847 16     Temp 04/21/24 1847 98.8 F (37.1 C)     Temp Source 04/21/24 1847 Oral     SpO2 04/21/24 1847 98 %     Weight --      Height --      Head Circumference --      Peak Flow --  Pain Score 04/21/24 1846 0     Pain Loc --      Pain Education --      Exclude from Growth Chart --    No data found.  Updated Vital Signs BP 106/72   Pulse 64   Temp 98.8 F (37.1 C) (Oral)   Resp 16   SpO2 98%   Visual Acuity Right Eye Distance:   Left Eye Distance:   Bilateral Distance:    Right Eye Near:   Left Eye Near:    Bilateral Near:     Physical Exam Vitals and nursing note reviewed.  Constitutional:      Appearance: Normal appearance.  HENT:     Head: Normocephalic and atraumatic.  Eyes:     Pupils: Pupils are equal, round, and reactive to light.  Cardiovascular:     Rate and Rhythm: Normal rate.  Pulmonary:     Effort: Pulmonary effort is normal.  Abdominal:     Tenderness: There is no right CVA tenderness or left CVA tenderness.  Skin:    General: Skin is warm and dry.  Neurological:     General: No focal deficit present.     Mental Status: She is  alert and oriented to person, place, and time.  Psychiatric:        Mood and Affect: Mood normal.        Behavior: Behavior normal.      UC Treatments / Results  Labs (all labs ordered are listed, but only abnormal results are displayed) Labs Reviewed  POCT URINALYSIS DIP (MANUAL ENTRY) - Abnormal; Notable for the following components:      Result Value   Blood, UA trace-intact (*)    All other components within normal limits  POCT URINE PREGNANCY  CERVICOVAGINAL ANCILLARY ONLY    EKG   Radiology No results found.  Procedures Procedures (including critical care time)  Medications Ordered in UC Medications - No data to display  Initial Impression / Assessment and Plan / UC Course  I have reviewed the triage vital signs and the nursing notes.  Pertinent labs & imaging results that were available during my care of the patient were reviewed by me and considered in my medical decision making (see chart for details).     Reviewed exam and symptoms with patient.  No red flags. UA neg for UTI and HCG negative.  Vaginal swab/STD testing is ordered we will contact for any positive results.  Patient prefers to wait results prior to initiating any treatment.  Advise rest fluids and PCP or GYN follow-up if symptoms do not improve.  ER precautions reviewed. Final Clinical Impressions(s) / UC Diagnoses   Final diagnoses:  Vaginal discharge  Screening examination for STD (sexually transmitted disease)     Discharge Instructions      The clinic will contact you with results of the testing done today if positive.  Please follow-up with your PCP or gynecologist if your symptoms do not improve.  Hope you feel better soon!     ED Prescriptions   None    PDMP not reviewed this encounter.   Loreda Myla SAUNDERS, NP 04/21/24 [phone]

## 2024-04-21 NOTE — ED Triage Notes (Signed)
 Pt c/o vaginal itching and yellow vaginal dischargex1wk.

## 2024-04-22 LAB — CERVICOVAGINAL ANCILLARY ONLY
Bacterial Vaginitis (gardnerella): NEGATIVE
Candida Glabrata: NEGATIVE
Candida Vaginitis: NEGATIVE
Chlamydia: NEGATIVE
Comment: NEGATIVE
Comment: NEGATIVE
Comment: NEGATIVE
Comment: NEGATIVE
Comment: NEGATIVE
Comment: NORMAL
Neisseria Gonorrhea: NEGATIVE
Trichomonas: NEGATIVE

## 2024-07-28 ENCOUNTER — Ambulatory Visit
Admission: RE | Admit: 2024-07-28 | Discharge: 2024-07-28 | Disposition: A | Discharge status: Home / Self Care | Source: Ambulatory Visit | Attending: Family Medicine | Admitting: Family Medicine

## 2024-07-28 ENCOUNTER — Other Ambulatory Visit: Payer: Self-pay

## 2024-07-28 VITALS — BP 105/68 | HR 74 | Temp 98.0°F | Resp 16

## 2024-07-28 DIAGNOSIS — N898 Other specified noninflammatory disorders of vagina: Secondary | ICD-10-CM | POA: Insufficient documentation

## 2024-07-28 DIAGNOSIS — R82998 Other abnormal findings in urine: Secondary | ICD-10-CM | POA: Insufficient documentation

## 2024-07-28 LAB — POCT URINE DIPSTICK
Bilirubin, UA: NEGATIVE
Blood, UA: NEGATIVE
Glucose, UA: NEGATIVE mg/dL
Ketones, POC UA: NEGATIVE mg/dL
Nitrite, UA: NEGATIVE
POC PROTEIN,UA: NEGATIVE
Spec Grav, UA: 1.025 (ref 1.010–1.025)
Urobilinogen, UA: 0.2 U/dL
pH, UA: 6.5 (ref 5.0–8.0)

## 2024-07-28 LAB — POCT URINE PREGNANCY: Preg Test, Ur: NEGATIVE

## 2024-07-28 NOTE — ED Provider Notes (Addendum)
 [facility]    CSN: [id number] Arrival date & time: 07/28/24  1100      History   Chief Complaint Chief Complaint  Patient presents with   Vaginal Itching    Entered by patient    HPI Madison Garcia is a 23 y.o. female presents for vaginal discharge.  Patient reports 1 week of a intermittently itching non-malodorous green/white vaginal discharge.  Denies any dysuria, fevers, nausea/vomiting or flank pain.  No known STD exposure but would like screening.  Has a history of yeast infections but is not sure if this is similar to previous symptoms.  No OTC medications have been used since onset.  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 No pertinent surgical history.  OB History   No obstetric history on file.      Home Medications    Prior to Admission medications   Medication Sig Start Date End Date Taking? Authorizing Provider  [employer]S D3 25 MCG (1000 UT) capsule Take 1,000 Units by mouth daily. 08/14/23   [provider]  fluconazole  (DIFLUCAN ) 150 MG tablet Take 1 tablet (150 mg total) by mouth every three (3) days as needed. May repeat in 3 days if symptoms not resolved 03/04/24   Mecum, Erin E, PA-C  JUNEL 1/20 1-20 MG-MCG tablet Take 1 tablet by mouth daily. 01/23/23   [provider]     Family History Family History  Problem Relation Age of Onset   Migraines Maternal Grandmother    Migraines Other    Autism Other     Social History Social History   Tobacco Use   Smoking status: Never   Smokeless tobacco: Never  Vaping Use   Vaping status: Never Used  Substance Use Topics   Alcohol use: Yes    Comment: occ   Drug use: Never     Allergies   Patient has no known allergies.   Review of Systems Review of Systems  Genitourinary:  Positive for vaginal discharge.     Physical Exam Triage Vital Signs ED Triage Vitals  Encounter Vitals Group     BP 07/28/24 1108 105/68     Girls Systolic BP Percentile --      Girls Diastolic BP Percentile --      Boys Systolic BP Percentile --      Boys Diastolic BP Percentile --      Pulse Rate 07/28/24 1108 74     Resp 07/28/24 1108 16     Temp 07/28/24 1108 98 F (36.7 C)     Temp Source 07/28/24 1108 Oral     SpO2 07/28/24 1108 96 %     Weight --      Height --      Head Circumference --      Peak Flow --      Pain  Score 07/28/24 1107 0     Pain Loc --      Pain Education --      Exclude from Growth Chart --    No data found.  Updated Vital Signs BP 105/68   Pulse 74   Temp 98 F (36.7 C) (Oral)   Resp 16   SpO2 96%   Visual Acuity Right Eye Distance:   Left Eye Distance:   Bilateral Distance:    Right Eye Near:   Left Eye Near:    Bilateral Near:     Physical Exam Vitals and nursing note reviewed.  Constitutional:      Appearance: Normal appearance.  HENT:     Head: Normocephalic and atraumatic.  Eyes:     Pupils: Pupils are equal, round, and reactive to light.  Cardiovascular:     Rate and Rhythm: Normal rate.  Pulmonary:     Effort: Pulmonary effort is normal.  Skin:    General: Skin is warm and dry.  Neurological:     General: No focal deficit present.     Mental Status: She is alert and oriented to person, place, and time.  Psychiatric:        Mood and Affect: Mood  normal.        Behavior: Behavior normal.      UC Treatments / Results  Labs (all labs ordered are listed, but only abnormal results are displayed) Labs Reviewed  POCT URINE DIPSTICK - Abnormal; Notable for the following components:      Result Value   Clarity, UA hazy (*)    Leukocytes, UA Small (1+) (*)    All other components within normal limits  URINE CULTURE  POCT URINE PREGNANCY  CERVICOVAGINAL ANCILLARY ONLY    EKG   Radiology No results found.  Procedures Procedures (including critical care time)  Medications Ordered in UC Medications - No data to display  Initial Impression / Assessment and Plan / UC Course  I have reviewed the triage vital signs and the nursing notes.  Pertinent labs & imaging results that were available during my care of the patient were reviewed by me and considered in my medical decision making (see chart for details).     Vaginal swab/STD testing is ordered and will contact for any positive results.  Patient prefers to wait results prior to initiating any treatment.  Urine with small leuks, will send urine culture as patient is asymptomatic and contact if treatment is needed.  Advised PCP or gynecology follow-up if symptoms do not improve.  ER precautions reviewed. Final Clinical Impressions(s) / UC Diagnoses   Final diagnoses:  Vaginal discharge  Leukocytes in urine     Discharge Instructions      The clinical contact you with the results of the testing done today are positive.  Please follow-up with your PCP or gynecologist if your symptoms are not improving.  Please go to the ER for any worsening symptoms.  Hope you feel better soon!    ED Prescriptions   None    PDMP not reviewed this encounter.   Loreda Myla SAUNDERS, NP 07/28/24 1128    Loreda Myla SAUNDERS, NP 07/28/24 1149

## 2024-07-28 NOTE — ED Triage Notes (Signed)
 Pt c/o vaginal itching and int green vaginal dischargex1wk

## 2024-07-28 NOTE — Discharge Instructions (Addendum)
 The clinical contact you with the results of the testing done today are positive.  Please follow-up with your PCP or gynecologist if your symptoms are not improving.  Please go to the ER for any worsening symptoms.  Hope you feel better soon!

## 2024-07-29 LAB — URINE CULTURE: Culture: NO GROWTH

## 2024-07-29 LAB — CERVICOVAGINAL ANCILLARY ONLY
Bacterial Vaginitis (gardnerella): NEGATIVE
Candida Glabrata: NEGATIVE
Candida Vaginitis: NEGATIVE
Chlamydia: NEGATIVE
Comment: NEGATIVE
Comment: NEGATIVE
Comment: NEGATIVE
Comment: NEGATIVE
Comment: NEGATIVE
Comment: NORMAL
Neisseria Gonorrhea: NEGATIVE
Trichomonas: NEGATIVE

## 2024-07-31 ENCOUNTER — Ambulatory Visit (HOSPITAL_COMMUNITY): Payer: Self-pay

## 2024-08-29 ENCOUNTER — Ambulatory Visit (HOSPITAL_COMMUNITY)
Admission: EM | Admit: 2024-08-29 | Discharge: 2024-08-29 | Disposition: A | Discharge status: Home / Self Care | Attending: Family Medicine | Admitting: Family Medicine

## 2024-08-29 ENCOUNTER — Encounter (HOSPITAL_COMMUNITY): Payer: Self-pay

## 2024-08-29 DIAGNOSIS — Z202 Contact with and (suspected) exposure to infections with a predominantly sexual mode of transmission: Secondary | ICD-10-CM | POA: Diagnosis not present

## 2024-08-29 DIAGNOSIS — Z3202 Encounter for pregnancy test, result negative: Secondary | ICD-10-CM | POA: Diagnosis not present

## 2024-08-29 DIAGNOSIS — N760 Acute vaginitis: Secondary | ICD-10-CM | POA: Diagnosis present

## 2024-08-29 LAB — POCT URINE PREGNANCY: Preg Test, Ur: NEGATIVE

## 2024-08-29 LAB — POCT URINE DIPSTICK
Bilirubin, UA: NEGATIVE
Glucose, UA: 250 mg/dL — AB
Ketones, POC UA: NEGATIVE mg/dL
Leukocytes, UA: NEGATIVE
Nitrite, UA: NEGATIVE
POC PROTEIN,UA: NEGATIVE
Spec Grav, UA: >=1.03 — AB (ref 1.010–1.025)
Urobilinogen, UA: 0.2 U/dL
pH, UA: 6 (ref 5.0–8.0)

## 2024-08-29 MED ORDER — FLUCONAZOLE 150 MG PO TABS: 150.0000 mg | ORAL_TABLET | ORAL | 0 refills | Status: AC

## 2024-08-29 NOTE — ED Triage Notes (Signed)
 Patient c/o vaginal itching. Would like STI-testing with blood work.

## 2024-08-29 NOTE — ED Provider Notes (Signed)
 [facility]    CSN: [id number] Arrival date & time: 08/29/24  0806      History   Chief Complaint Chief Complaint  Patient presents with   SEXUALLY TRANSMITTED DISEASE    HPI Madison Garcia is a 23 y.o. female.   HPI Here for vaginal itching.  Is been going on for about 2 weeks.  No discharge and no abnormal smell.  No dysuria.  She states she feels a little bloated but has had trouble having constipation lately.  Last menstrual cycle was a long time ago.  She takes her oral contraceptives continuously.  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 No pertinent surgical history.  OB History   No obstetric history on file.      Home Medications    Prior to Admission medications   Medication Sig Start Date End Date Taking? Authorizing Provider  [facility]S D3 25 MCG (1000 UT) capsule Take 1,000 Units by mouth daily. 08/14/23  Yes [provider]  fluconazole  (DIFLUCAN ) 150 MG tablet Take 1 tablet (150 mg total) by mouth every 3 (three) days for 2 doses. 08/29/24 09/02/24 Yes Diamond Martucci K, MD  JUNEL 1/20 1-20 MG-MCG tablet Take 1 tablet by mouth daily. 01/23/23   Yes [provider]    Family History Family History  Problem Relation Age of Onset   Migraines Maternal Grandmother    Migraines Other    Autism Other     Social History Social History   Tobacco Use   Smoking status: Never   Smokeless tobacco: Never  Vaping Use   Vaping status: Never Used  Substance Use Topics   Alcohol use: Yes    Comment: occ   Drug use: Never     Allergies   Patient has no known allergies.   Review of Systems Review of Systems   Physical Exam Triage Vital Signs ED Triage Vitals [08/29/24 0836]  Encounter Vitals Group     BP 106/71     Girls Systolic BP Percentile      Girls Diastolic BP Percentile      Boys Systolic BP Percentile      Boys Diastolic BP Percentile      Pulse Rate 74     Resp 18     Temp 98.4 F (36.9 C)     Temp Source Oral     SpO2 98 %     Weight      Height      Head Circumference      Peak Flow      Pain Score      Pain Loc      Pain Education  Exclude from Growth Chart    No data found.  Updated Vital Signs BP 106/71 (BP Location: Right Arm)   Pulse 74   Temp 98.4 F (36.9 C) (Oral)   Resp 18   SpO2 98%   Visual Acuity Right Eye Distance:   Left Eye Distance:   Bilateral Distance:    Right Eye Near:   Left Eye Near:    Bilateral Near:     Physical Exam Vitals reviewed.  Constitutional:      General: She is not in acute distress.    Appearance: She is not toxic-appearing.  Skin:    Coloration: Skin is not pale.  Neurological:     Mental Status: She is alert and oriented to person, place, and time.  Psychiatric:        Behavior: Behavior normal.      UC Treatments / Results  Labs (all labs ordered are listed, but only abnormal results are displayed) Labs Reviewed  HIV ANTIBODY (ROUTINE TESTING W REFLEX)  RPR  POCT URINE DIPSTICK  POCT URINE PREGNANCY  CERVICOVAGINAL ANCILLARY ONLY    EKG   Radiology No results found.  Procedures Procedures (including  critical care time)  Medications Ordered in UC Medications - No data to display  Initial Impression / Assessment and Plan / UC Course  I have reviewed the triage vital signs and the nursing notes.  Pertinent labs & imaging results that were available during my care of the patient were reviewed by me and considered in my medical decision making (see chart for details).     Blood is drawn for HIV and RPR, and staff will notify them if any of that is positive  Vaginal self swab is done, and we will notify of any positives on that and treat per protocol.  She does not have any other symptoms that would indicate she is pregnant, including no nausea or vomiting and no breast tenderness.  I think her urine pregnancy test was accurate  Fluconazole  was sent in to treat empirically for possible yeast vaginitis. Final Clinical Impressions(s) / UC Diagnoses   Final diagnoses:  Acute vaginitis  Potential exposure to STD     Discharge Instructions      Staff will notify you if there is anything positive on the swab or on the bloodwork. It can take 2-3 days for the tests to result, depending on the day of the week your test was taken. You will only be notified if there are any positives on the testing; test results will also go to your MyChart if you are signed up for MyChart.   Take fluconazole  150 mg--1 tablet every 3 days for 2 doses      ED Prescriptions     Medication Sig Dispense Auth. Provider   fluconazole  (DIFLUCAN ) 150 MG tablet Take 1 tablet (150 mg total) by mouth every 3 (three) days for 2 doses. 2 tablet Vonna Sharlet POUR, MD      PDMP not reviewed this encounter.   Vonna Sharlet POUR, MD 08/29/24 [phone]

## 2024-08-29 NOTE — Discharge Instructions (Signed)
 Staff will notify you if there is anything positive on the swab or on the bloodwork. It can take 2-3 days for the tests to result, depending on the day of the week your test was taken. You will only be notified if there are any positives on the testing; test results will also go to your MyChart if you are signed up for MyChart.   Take fluconazole  150 mg--1 tablet every 3 days for 2 doses

## 2024-08-30 LAB — SYPHILIS: RPR W/REFLEX TO RPR TITER AND TREPONEMAL ANTIBODIES, TRADITIONAL SCREENING AND DIAGNOSIS ALGORITHM: RPR Ser Ql: NONREACTIVE

## 2024-08-31 LAB — CERVICOVAGINAL ANCILLARY ONLY
Bacterial Vaginitis (gardnerella): NEGATIVE
Candida Glabrata: NEGATIVE
Candida Vaginitis: NEGATIVE
Chlamydia: NEGATIVE
Comment: NEGATIVE
Comment: NEGATIVE
Comment: NEGATIVE
Comment: NEGATIVE
Comment: NEGATIVE
Comment: NORMAL
Neisseria Gonorrhea: NEGATIVE
Trichomonas: NEGATIVE

## 2024-09-01 LAB — MISC LABCORP TEST (SEND OUT): Labcorp test code: 83935

## 2024-09-14 ENCOUNTER — Ambulatory Visit
Admission: EM | Admit: 2024-09-14 | Discharge: 2024-09-14 | Disposition: A | Discharge status: Home / Self Care | Attending: Family Medicine | Admitting: Family Medicine

## 2024-09-14 DIAGNOSIS — R102 Pelvic and perineal pain unspecified side: Secondary | ICD-10-CM

## 2024-09-14 DIAGNOSIS — R519 Headache, unspecified: Secondary | ICD-10-CM | POA: Diagnosis not present

## 2024-09-14 LAB — POCT URINE DIPSTICK
Bilirubin, UA: NEGATIVE
Blood, UA: NEGATIVE
Glucose, UA: NEGATIVE mg/dL
Ketones, POC UA: NEGATIVE mg/dL
Nitrite, UA: NEGATIVE
POC PROTEIN,UA: 30 — AB
Spec Grav, UA: 1.015 (ref 1.010–1.025)
Urobilinogen, UA: 0.2 U/dL
pH, UA: 8.5 — AB (ref 5.0–8.0)

## 2024-09-14 LAB — POCT URINE PREGNANCY: Preg Test, Ur: NEGATIVE

## 2024-09-14 MED ORDER — KETOROLAC TROMETHAMINE 30 MG/ML IJ SOLN
30.0000 mg | Freq: Once | INTRAMUSCULAR | Status: AC
  Administered 2024-09-14: 30 mg via INTRAMUSCULAR

## 2024-09-14 NOTE — ED Provider Notes (Signed)
 [facility]  Note:  This document was prepared using Conservation officer, historic buildings and may include unintentional dictation errors.  MRN: [id number] DOB: [birth year]  Subjective:   Madison Garcia is a 23 y.o. female presenting for 2 week history of persistent frontal headaches, photophobia, nausea and mild occasional lower abdominal/pelvic pain. Had a runny nose, cough yesterday but is resolved today. Denies fever, vomiting, rashes, dysuria, urinary frequency, hematuria, vaginal discharge.  Has a history of headaches, migraines, tension headaches.  Was previously seen at the headache clinic but was lost to follow-up through COVID.  No current facility-administered medications for this encounter.  Current Outpatient Medications:    CVS D3 25 MCG (1000 UT) capsule, Take 1,000 Units by mouth daily., Disp: , Rfl:    JUNEL 1/20 1-20 MG-MCG tablet, Take 1 tablet by mouth daily., Disp: , Rfl:    No Known Allergies  Past Medical History:  Diagnosis Date   Headache    Urinary tract infection      History reviewed. No pertinent surgical history.  Family History  Problem Relation Age of Onset   Migraines Maternal Grandmother    Migraines Other    Autism Other     Social History   Tobacco Use   Smoking status: Never   Smokeless tobacco: Never  Vaping Use   Vaping status: Never Used  Substance Use Topics   Alcohol use: Yes    Comment: occ   Drug use: Never    ROS   Objective:   Vitals: BP 99/65 (BP Location: Left Arm)   Pulse 83   Temp 98.3 F (36.8 C) (Oral)   Resp 16   SpO2 97%   Physical Exam Constitutional:      General: She is not in acute distress.    Appearance: Normal appearance. She is well-developed and normal weight. She is not ill-appearing, toxic-appearing or diaphoretic.  HENT:     Head: Normocephalic and atraumatic.     Right Ear: Tympanic membrane, ear canal and external ear normal. No drainage or tenderness. No middle  ear effusion. There is no impacted cerumen. Tympanic membrane is not erythematous or bulging.     Left Ear: Tympanic membrane, ear canal and external ear normal. No drainage or tenderness.  No middle ear effusion. There is no impacted cerumen. Tympanic membrane is not erythematous or bulging.     Nose: Nose normal. No congestion or rhinorrhea.     Mouth/Throat:     Mouth: Mucous membranes are moist. No oral lesions.     Pharynx: Oropharynx is clear. No pharyngeal swelling, oropharyngeal exudate, posterior oropharyngeal erythema or uvula swelling.     Tonsils: No tonsillar exudate or tonsillar abscesses. 0 on the right. 0 on the left.  Eyes:     General: No scleral icterus.       Right eye: No discharge.        Left eye: No discharge.     Extraocular Movements: Extraocular movements intact.     Right eye: Normal extraocular motion.     Left eye: Normal extraocular motion.     Conjunctiva/sclera: Conjunctivae normal.     Pupils: Pupils are equal, round, and reactive to light.     Comments: Photophobia noted.  Neck:     Meningeal: Brudzinski's sign and Kernig's sign absent.  Cardiovascular:     Rate and Rhythm: Normal rate and regular rhythm.     Heart sounds: Normal heart sounds. No murmur heard.  No friction rub. No gallop.  Pulmonary:     Effort: Pulmonary effort is normal. No respiratory distress.     Breath sounds: No stridor. No wheezing, rhonchi or rales.  Chest:     Chest wall: No tenderness.  Abdominal:     General: Bowel sounds are normal. There is no distension.     Palpations: Abdomen is soft. There is no mass.     Tenderness: There is abdominal tenderness (mild) in the suprapubic area. There is no right CVA tenderness, left CVA tenderness, guarding or rebound.  Musculoskeletal:     Cervical back: Normal range of motion and neck supple.  Lymphadenopathy:     Cervical: No cervical adenopathy.  Skin:    General: Skin is warm and dry.  Neurological:     General: No  focal deficit present.     Mental Status: She is alert and oriented to person, place, and time.     Cranial Nerves: No cranial nerve deficit, dysarthria or facial asymmetry.     Motor: No weakness or pronator drift.     Coordination: Romberg sign negative. Coordination normal. Finger-Nose-Finger Test and Heel to [facility] Test normal. Rapid alternating movements normal.     Gait: Gait and tandem walk normal.     Deep Tendon Reflexes: Reflexes normal.  Psychiatric:        Mood and Affect: Mood normal.        Behavior: Behavior normal.        Thought Content: Thought content normal.        Judgment: Judgment normal.     Results for orders placed or performed during the hospital encounter of 09/14/24 (from the past 24 hours)  POCT URINE DIPSTICK     Status: Abnormal   Collection Time: 09/14/24  2:33 PM  Result Value Ref Range   Color, UA yellow yellow   Clarity, UA clear clear   Glucose, UA negative negative mg/dL   Bilirubin, UA negative negative   Ketones, POC UA negative negative mg/dL   Spec Grav, UA 8.984 8.989 - 1.025   Blood, UA negative negative   pH, UA 8.5 (A) 5.0 - 8.0   POC PROTEIN,UA =30 (A) negative, trace   Urobilinogen, UA 0.2 0.2 or 1.0 E.U./dL   Nitrite, UA Negative Negative   Leukocytes, UA Small (1+) (A) Negative  POCT urine pregnancy     Status: None   Collection Time: 09/14/24  2:33 PM  Result Value Ref Range   Preg Test, Ur Negative Negative   IM Toradol 30 mg administered in clinic.  Assessment and Plan :   PDMP not reviewed this encounter.  1. Frontal headache   2. Acute pelvic pain, female    No signs of an acute encephalopathy on exam today.  Recommended follow-up with the headache clinic.  Will defer antibiotic use given lack of consistent respiratory symptoms, sinus symptoms.  Recommend general headache management, health maintenance.  Patient declined STI testing given that she just got tested and was normal.  Can use ibuprofen  and general supportive  care otherwise.  Counseled patient on potential for adverse effects with medications prescribed/recommended today, ER and return-to-clinic precautions discussed, patient verbalized understanding.    Christopher Savannah, [place] 09/14/24 1454

## 2024-09-14 NOTE — ED Triage Notes (Signed)
 Pt reports headache, nausea, low abdominal discomfort x 2 weeks. Ibuprofen  gives no relief.

## 2024-09-14 NOTE — Discharge Instructions (Addendum)
 Follow up with the headache clinic. You have received an injection of Toradol  for your severe headache today. Otherwise, we will manage this as a viral illness. For sore throat or cough try using a honey-based tea. Use 3 teaspoons of honey with juice squeezed from half lemon. Place shaved pieces of ginger into 1/2-1 cup of water and warm over stove top. Then mix the ingredients and repeat every 4 hours as needed. Please take ibuprofen  600mg  every 6 hours with food alternating with OR taken together with Tylenol 500mg -650mg  every 6 hours for throat pain, fevers, aches and pains. Hydrate very well with at least 2 liters of water. Eat light meals such as soups (chicken and noodles, vegetable, chicken and wild rice).  Do not eat foods that you are allergic to.  Taking an antihistamine like Zyrtec  (10mg  daily) can help against postnasal drainage, sinus congestion which can cause sinus pain, sinus headaches, throat pain, painful swallowing, coughing.  You can take this together with pseudoephedrine (Sudafed) at a dose of 30mg  3 times a day or twice daily as needed for the same kind of nasal drip, congestion.

## 2024-10-18 ENCOUNTER — Ambulatory Visit: Admitting: Radiology

## 2024-10-18 ENCOUNTER — Other Ambulatory Visit: Payer: Self-pay

## 2024-10-18 ENCOUNTER — Ambulatory Visit
Admission: RE | Admit: 2024-10-18 | Discharge: 2024-10-18 | Disposition: A | Discharge status: Home / Self Care | Source: Ambulatory Visit | Attending: Internal Medicine | Admitting: Internal Medicine

## 2024-10-18 VITALS — BP 110/68 | HR 82 | Temp 98.0°F | Resp 17

## 2024-10-18 DIAGNOSIS — R079 Chest pain, unspecified: Secondary | ICD-10-CM | POA: Diagnosis not present

## 2024-10-18 DIAGNOSIS — S29011A Strain of muscle and tendon of front wall of thorax, initial encounter: Secondary | ICD-10-CM

## 2024-10-18 MED ORDER — CYCLOBENZAPRINE HCL 5 MG PO TABS: 5.0000 mg | ORAL_TABLET | Freq: Three times a day (TID) | ORAL | 0 refills | Status: AC | PRN

## 2024-10-18 MED ORDER — PREDNISONE 20 MG PO TABS: 20.0000 mg | ORAL_TABLET | Freq: Every day | ORAL | 0 refills | Status: AC

## 2024-10-18 NOTE — Discharge Instructions (Addendum)
 Chest x-ray done today.  Final evaluation by the radiologist is still pending but on brief evaluation there does not appear to be any acute abnormalities.  Once the radiologist has finalized the images if any changes is needed in your treatment plan we will contact you. EKG done today.  This shows sinus rhythm. Symptoms and physical exam findings are most consistent with muscle strain and muscle inflammation.  Vital signs, physical exam findings as well as testing done today are reassuring.  We can treat this with stronger anti-inflammatories as well as muscle relaxers.  He will need to monitor your symptoms at home and if you feel that things are worsening or not improving then you may need to go to the emergency room for further evaluation.  We recommend the following: Prednisone  20 mg (1 tablet) once daily for 5 days. Take this in the morning.  This is a steroid to help with inflammation and pain. Flexeril  5 mg every 8 hours as needed for muscle spasms.  Use caution as this medication can cause drowsiness. Light stretching to improve mobility. May alternate heat and ice to help with symptoms.  Make sure to stay hydrated by drinking plenty of water. If you feel that your symptoms are worsening significantly then would recommend going to the emergency room for further evaluation Return to urgent care or PCP if symptoms fail to resolve.

## 2024-10-18 NOTE — ED Triage Notes (Signed)
 Pt c/o chest pain with activity. States first time it happened was December 13th and then again on Jan 2nd and has been hurting since. States it makes chest feel tight.

## 2024-10-18 NOTE — ED Provider Notes (Signed)
 " [facility]    CSN: [id number] Arrival date & time: 10/18/24  1523      History   Chief Complaint Chief Complaint  Patient presents with   Chest Injury    Chest pain - Entered by patient    HPI Madison Garcia is a 24 y.o. female.   24 year old female presents urgent care with complaints of chest pain with activity.  She reports that the pain is especially in the center part of the chest and going out towards the left side.  She first noticed this on December 13 but it did go away.  The next time she noticed this was January 2 and it has been hurting since then.  The pain is positional being worse when she turns or twist or when she bends over.  The pain is also more severe with palpation.  She denies any history of this in the past.  She has not had any injury to the area.     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 No pertinent surgical history.  OB History   No obstetric history on file.      Home Medications    Prior to Admission medications  Medication Sig Start Date End Date Taking? Authorizing Provider  cyclobenzaprine  (FLEXERIL ) 5 MG tablet Take 1 tablet (5 mg total) by mouth every 8 (eight) hours as needed for muscle spasms. 10/18/24  Yes Marsi Turvey A, PA-C  predniSONE  (DELTASONE ) 20 MG tablet Take 1 tablet (20 mg total) by mouth daily with  breakfast for 5 days. 10/18/24 10/23/24 Yes Olisa Quesnel A, PA-C  [facility] D3 25 MCG (1000 UT) capsule Take 1,000 Units by mouth daily. 08/14/23   [provider]  JUNEL 1/20 1-20 MG-MCG tablet Take 1 tablet by mouth daily. 01/23/23   [provider]    Family History Family History  Problem Relation Age of Onset   Migraines Maternal Grandmother    Migraines Other    Autism Other     Social History Social History[1]   Allergies   Patient has no known allergies.   Review of Systems Review of Systems   Physical Exam Triage Vital Signs ED Triage Vitals  Encounter Vitals Group     BP 10/18/24 1530 110/68     Girls Systolic BP Percentile --      Girls Diastolic BP Percentile --      Boys Systolic BP Percentile --      Boys Diastolic BP Percentile --      Pulse Rate 10/18/24 1530 82     Resp 10/18/24 1530 17     Temp 10/18/24 1530 98 F (36.7 C)     Temp Source 10/18/24 1530 Oral     SpO2 10/18/24 1530 98 %     Weight --      Height --      Head Circumference --  Peak Flow --      Pain Score 10/18/24 1533 7     Pain Loc --      Pain Education --      Exclude from Growth Chart --    No data found.  Updated Vital Signs BP 110/68 (BP Location: Right Arm)   Pulse 82   Temp 98 F (36.7 C) (Oral)   Resp 17   SpO2 98%   Visual Acuity Right Eye Distance:   Left Eye Distance:   Bilateral Distance:    Right Eye Near:   Left Eye Near:    Bilateral Near:     Physical Exam   UC Treatments / Results  Labs (all labs ordered are listed, but only abnormal results are displayed) Labs Reviewed - No data to display  EKG   Radiology No results found.  Procedures Procedures (including critical care time)  Medications Ordered in UC Medications - No data to display  Initial Impression / Assessment and Plan / UC Course  I have reviewed the triage vital signs and the nursing notes.  Pertinent labs & imaging results that were available during  my care of the patient were reviewed by me and considered in my medical decision making (see chart for details).     Muscle strain of anterior chest wall  Exertional chest pain - Plan: DG Chest 2 View, DG Chest 2 View   Chest x-ray done today.  Final evaluation by the radiologist is still pending but on brief evaluation there does not appear to be any acute abnormalities.  Once the radiologist has finalized the images if any changes is needed in your treatment plan we will contact you. EKG done today.  This shows sinus rhythm. Symptoms and physical exam findings are most consistent with muscle strain and muscle inflammation.  Vital signs, physical exam findings as well as testing done today are reassuring.  We can treat this with stronger anti-inflammatories as well as muscle relaxers.  He will need to monitor your symptoms at home and if you feel that things are worsening or not improving then you may need to go to the emergency room for further evaluation.  We recommend the following: Prednisone  20 mg (1 tablet) once daily for 5 days. Take this in the morning.  This is a steroid to help with inflammation and pain. Flexeril  5 mg every 8 hours as needed for muscle spasms.  Use caution as this medication can cause drowsiness. Light stretching to improve mobility. May alternate heat and ice to help with symptoms.  Make sure to stay hydrated by drinking plenty of water. If you feel that your symptoms are worsening significantly then would recommend going to the emergency room for further evaluation Return to urgent care or PCP if symptoms fail to resolve.    Final Clinical Impressions(s) / UC Diagnoses   Final diagnoses:  Exertional chest pain  Muscle strain of anterior chest wall     Discharge Instructions      Chest x-ray done today.  Final evaluation by the radiologist is still pending but on brief evaluation there does not appear to be any acute abnormalities.  Once the radiologist has  finalized the images if any changes is needed in your treatment plan we will contact you. EKG done today.  This shows sinus rhythm. Symptoms and physical exam findings are most consistent with muscle strain and muscle inflammation.  Vital signs, physical exam findings as well as testing done today are reassuring.  We can treat this with stronger anti-inflammatories as well as muscle relaxers.  He will need to monitor your symptoms at home and if you feel that things are worsening or not improving then you may need to go to the emergency room for further evaluation.  We recommend the following: Prednisone  20 mg (1 tablet) once daily for 5 days. Take this in the morning.  This is a steroid to help with inflammation and pain. Flexeril  5 mg every 8 hours as needed for muscle spasms.  Use caution as this medication can cause drowsiness. Light stretching to improve mobility. May alternate heat and ice to help with symptoms.  Make sure to stay hydrated by drinking plenty of water. If you feel that your symptoms are worsening significantly then would recommend going to the emergency room for further evaluation Return to urgent care or PCP if symptoms fail to resolve.      ED Prescriptions     Medication Sig Dispense Auth. Provider   predniSONE  (DELTASONE ) 20 MG tablet Take 1 tablet (20 mg total) by mouth daily with breakfast for 5 days. 5 tablet Tore Carreker A, PA-C   cyclobenzaprine  (FLEXERIL ) 5 MG tablet Take 1 tablet (5 mg total) by mouth every 8 (eight) hours as needed for muscle spasms. 30 tablet Teresa Almarie LABOR, [place]      PDMP not reviewed this encounter.    [1]  Social History Tobacco Use   Smoking status: Never   Smokeless tobacco: Never  Vaping Use   Vaping status: Never Used  Substance Use Topics   Alcohol use: Yes    Comment: occ   Drug use: Never     Teresa Almarie LABOR, PA-C 10/18/24 1608  "
